# Patient Record
Sex: Female | Born: 1965
Health system: Southern US, Community
[De-identification: ages and names within clinical notes are randomized; demographics above are authoritative.]

## PROBLEM LIST (undated history)

## (undated) DIAGNOSIS — Z789 Other specified health status: Secondary | ICD-10-CM

## (undated) HISTORY — PX: NO PAST SURGERIES: SHX2092

## (undated) HISTORY — PX: COLONOSCOPY: SHX174

---

## 2005-06-08 HISTORY — PX: BREAST ENHANCEMENT SURGERY: SHX7

## 2016-04-21 DIAGNOSIS — D531 Other megaloblastic anemias, not elsewhere classified: Secondary | ICD-10-CM | POA: Insufficient documentation

## 2016-04-21 DIAGNOSIS — F411 Generalized anxiety disorder: Secondary | ICD-10-CM | POA: Insufficient documentation

## 2016-04-21 DIAGNOSIS — R42 Dizziness and giddiness: Secondary | ICD-10-CM | POA: Insufficient documentation

## 2016-04-28 ENCOUNTER — Other Ambulatory Visit: Payer: Self-pay | Admitting: Internal Medicine

## 2016-04-28 DIAGNOSIS — Z1231 Encounter for screening mammogram for malignant neoplasm of breast: Secondary | ICD-10-CM

## 2016-06-10 ENCOUNTER — Ambulatory Visit: Payer: BLUE CROSS/BLUE SHIELD

## 2016-06-30 ENCOUNTER — Other Ambulatory Visit: Payer: Self-pay | Admitting: Internal Medicine

## 2016-06-30 ENCOUNTER — Ambulatory Visit
Admission: RE | Admit: 2016-06-30 | Discharge: 2016-06-30 | Disposition: A | Discharge status: Home / Self Care | Payer: Federal, State, Local not specified - PPO | Source: Ambulatory Visit | Attending: Internal Medicine | Admitting: Internal Medicine

## 2016-06-30 DIAGNOSIS — R928 Other abnormal and inconclusive findings on diagnostic imaging of breast: Secondary | ICD-10-CM | POA: Diagnosis not present

## 2016-06-30 DIAGNOSIS — Z1231 Encounter for screening mammogram for malignant neoplasm of breast: Secondary | ICD-10-CM | POA: Diagnosis present

## 2016-07-02 ENCOUNTER — Other Ambulatory Visit: Payer: Self-pay | Admitting: Internal Medicine

## 2016-07-02 DIAGNOSIS — R928 Other abnormal and inconclusive findings on diagnostic imaging of breast: Secondary | ICD-10-CM

## 2016-07-02 DIAGNOSIS — N6489 Other specified disorders of breast: Secondary | ICD-10-CM

## 2016-07-27 ENCOUNTER — Ambulatory Visit
Admission: RE | Admit: 2016-07-27 | Discharge: 2016-07-27 | Disposition: A | Discharge status: Home / Self Care | Payer: Federal, State, Local not specified - PPO | Source: Ambulatory Visit | Attending: Internal Medicine | Admitting: Internal Medicine

## 2016-07-27 DIAGNOSIS — N6489 Other specified disorders of breast: Secondary | ICD-10-CM | POA: Insufficient documentation

## 2016-07-27 DIAGNOSIS — R928 Other abnormal and inconclusive findings on diagnostic imaging of breast: Secondary | ICD-10-CM | POA: Diagnosis not present

## 2016-07-28 ENCOUNTER — Other Ambulatory Visit: Payer: Self-pay | Admitting: Internal Medicine

## 2016-07-28 DIAGNOSIS — N6489 Other specified disorders of breast: Secondary | ICD-10-CM

## 2017-01-26 ENCOUNTER — Ambulatory Visit: Payer: BLUE CROSS/BLUE SHIELD

## 2017-01-26 ENCOUNTER — Other Ambulatory Visit: Payer: BLUE CROSS/BLUE SHIELD

## 2017-02-17 DIAGNOSIS — F329 Major depressive disorder, single episode, unspecified: Secondary | ICD-10-CM | POA: Diagnosis not present

## 2017-02-17 DIAGNOSIS — F411 Generalized anxiety disorder: Secondary | ICD-10-CM | POA: Diagnosis not present

## 2017-02-19 ENCOUNTER — Ambulatory Visit
Admission: RE | Admit: 2017-02-19 | Discharge: 2017-02-19 | Disposition: A | Discharge status: Home / Self Care | Payer: Federal, State, Local not specified - PPO | Source: Ambulatory Visit | Attending: Internal Medicine | Admitting: Internal Medicine

## 2017-02-19 DIAGNOSIS — N6489 Other specified disorders of breast: Secondary | ICD-10-CM

## 2017-02-19 DIAGNOSIS — R928 Other abnormal and inconclusive findings on diagnostic imaging of breast: Secondary | ICD-10-CM | POA: Diagnosis not present

## 2017-03-24 DIAGNOSIS — G8929 Other chronic pain: Secondary | ICD-10-CM | POA: Diagnosis not present

## 2017-03-24 DIAGNOSIS — M25561 Pain in right knee: Secondary | ICD-10-CM | POA: Diagnosis not present

## 2017-03-25 DIAGNOSIS — M25561 Pain in right knee: Secondary | ICD-10-CM | POA: Diagnosis not present

## 2017-03-31 DIAGNOSIS — F329 Major depressive disorder, single episode, unspecified: Secondary | ICD-10-CM | POA: Diagnosis not present

## 2017-03-31 DIAGNOSIS — F411 Generalized anxiety disorder: Secondary | ICD-10-CM | POA: Diagnosis not present

## 2017-04-02 DIAGNOSIS — H109 Unspecified conjunctivitis: Secondary | ICD-10-CM | POA: Diagnosis not present

## 2017-05-07 DIAGNOSIS — F329 Major depressive disorder, single episode, unspecified: Secondary | ICD-10-CM | POA: Diagnosis not present

## 2017-05-07 DIAGNOSIS — F411 Generalized anxiety disorder: Secondary | ICD-10-CM | POA: Diagnosis not present

## 2017-08-23 DIAGNOSIS — F411 Generalized anxiety disorder: Secondary | ICD-10-CM | POA: Diagnosis not present

## 2017-08-23 DIAGNOSIS — F329 Major depressive disorder, single episode, unspecified: Secondary | ICD-10-CM | POA: Diagnosis not present

## 2017-10-01 ENCOUNTER — Other Ambulatory Visit: Payer: Self-pay | Admitting: Internal Medicine

## 2017-10-01 DIAGNOSIS — N6489 Other specified disorders of breast: Secondary | ICD-10-CM

## 2017-10-13 DIAGNOSIS — R928 Other abnormal and inconclusive findings on diagnostic imaging of breast: Secondary | ICD-10-CM | POA: Diagnosis not present

## 2018-03-17 DIAGNOSIS — R05 Cough: Secondary | ICD-10-CM | POA: Diagnosis not present

## 2018-06-19 DIAGNOSIS — K047 Periapical abscess without sinus: Secondary | ICD-10-CM | POA: Diagnosis not present

## 2018-06-19 DIAGNOSIS — K0381 Cracked tooth: Secondary | ICD-10-CM | POA: Diagnosis not present

## 2018-07-24 ENCOUNTER — Emergency Department
Admission: EM | Admit: 2018-07-24 | Discharge: 2018-07-24 | Disposition: A | Discharge status: Home / Self Care | Payer: Federal, State, Local not specified - PPO | Attending: Emergency Medicine | Admitting: Emergency Medicine

## 2018-07-24 ENCOUNTER — Other Ambulatory Visit: Payer: Self-pay

## 2018-07-24 ENCOUNTER — Emergency Department: Payer: Federal, State, Local not specified - PPO

## 2018-07-24 DIAGNOSIS — R0789 Other chest pain: Secondary | ICD-10-CM | POA: Insufficient documentation

## 2018-07-24 LAB — BASIC METABOLIC PANEL
ANION GAP: 7 (ref 5–15)
BUN: 16 mg/dL (ref 6–20)
CALCIUM: 8.7 mg/dL — AB (ref 8.9–10.3)
CO2: 26 mmol/L (ref 22–32)
Chloride: 107 mmol/L (ref 98–111)
Creatinine, Ser: 0.81 mg/dL (ref 0.44–1.00)
GFR calc Af Amer: >60 mL/min (ref >60)
GFR calc non Af Amer: >60 mL/min (ref >60)
GLUCOSE: 70 mg/dL (ref 70–99)
POTASSIUM: 3.4 mmol/L — AB (ref 3.5–5.1)
Sodium: 140 mmol/L (ref 135–145)

## 2018-07-24 LAB — CBC
HCT: 37.8 % (ref 36.0–46.0)
Hemoglobin: 12 g/dL (ref 12.0–15.0)
MCH: 31.6 pg (ref 26.0–34.0)
MCHC: 31.7 g/dL (ref 30.0–36.0)
MCV: 99.5 fL (ref 80.0–100.0)
NRBC: 0 % (ref 0.0–0.2)
PLATELETS: 311 10*3/uL (ref 150–400)
RBC: 3.8 MIL/uL — ABNORMAL LOW (ref 3.87–5.11)
RDW: 11.5 % (ref 11.5–15.5)
WBC: 7.1 10*3/uL (ref 4.0–10.5)

## 2018-07-24 LAB — TROPONIN I

## 2018-07-24 NOTE — ED Triage Notes (Signed)
Pt states L sided CP x few weeks intermittently. States began working out and wasn't sure if the discomfort was from that. Denies SOB.   A&O, ambulatory. No distress noted.

## 2018-07-24 NOTE — ED Provider Notes (Signed)
[facility] Emergency Department Provider Note  ____________________________________________   I have reviewed the triage vital signs and the nursing notes. Where available I have reviewed prior notes and, if possible and indicated, outside hospital notes.    HISTORY  Chief Complaint Chest Pain    HPI Maria Buck is a 53 y.o. female   Who is a healthy woman she states at baseline, presents today complaining of right-sided and left-sided chest wall pain.  Patient recently began lifting weights.  And now, she has an achy discomfort in her chest wall and the pectoralis muscles.  She does not have exertional pain when she walks or exercises however, when she lifts weights using those muscles it hurts.  She also has some tenderness in her trapezius muscles because she was doing shrugs today.  She states that the pain seems to ease off and then when her physical trainer has her do more chest work, the pain comes back.  Does not have a history of weight lifting and any significant during the past.  She has no shortness of breath no leg swelling no pleuritic pain.  She has no fever no rash.  She has no nausea no vomiting.  She has no personal or family history of PE or DVT, no recent travel, no recent surgery she is not on any estrogens, pain is sharp, nonradiating, reproducible when she touches it or lies on that chest the wrong way.  He is convinced this is not involving her heart, but she does want to make sure.  History reviewed. No pertinent past medical history.  There are no active problems to display for this patient.   History reviewed. No pertinent surgical history.  Prior to Admission medications   Not on File    Allergies Patient has no known allergies.  Family History  Problem Relation Age of Onset  . Breast cancer Maternal Grandmother 61    Social History Social History   Tobacco Use  . Smoking status: Never Smoker  Substance Use Topics   . Alcohol use: Yes    Comment: rare   . Drug use: Not on file    Review of Systems Constitutional: No fever/chills Eyes: No visual changes. ENT: No sore throat. No stiff neck no neck pain Cardiovascular: See HPI regarding chest pain. Respiratory: Denies shortness of breath. Gastrointestinal:   no vomiting.  No diarrhea.  No constipation. Genitourinary: Negative for dysuria. Musculoskeletal: Negative lower extremity swelling Skin: Negative for rash. Neurological: Negative for severe headaches, focal weakness or numbness.   ____________________________________________   PHYSICAL EXAM:  VITAL SIGNS: ED Triage Vitals  Enc Vitals Group     BP 07/24/18 1319 (!) 139/91     Pulse Rate 07/24/18 1319 75     Resp 07/24/18 1319 18     Temp 07/24/18 1319 97.6 F (36.4 C)     Temp Source 07/24/18 1319 Oral     SpO2 07/24/18 1319 97 %     Weight 07/24/18 1320 205 lb (93 kg)     Height 07/24/18 1320 5\' 9"  (1.753 m)     Head Circumference --      Peak Flow --      Pain Score 07/24/18 1320 6     Pain Loc --      Pain Edu? --      Excl. in [place]? --     Constitutional: Alert and oriented. Well appearing and in no acute distress. Eyes: Conjunctivae are normal Head: Atraumatic HEENT: No  congestion/rhinnorhea. Mucous membranes are moist.  Oropharynx non-erythematous Neck:   Nontender with no meningismus, no masses, no stridor Cardiovascular: Normal rate, regular rhythm. Grossly normal heart sounds.  Good peripheral circulation. Chest: Female chaperone present, exam with consent of the patient.  There is tenderness palpation to the pectoralis muscles more on the left than the right but bilaterally, also when I range the patient's arm it hurts.  No crepitus no flail chest.  When I touch these areas patient states "ouch that is the pain right there" and pulls back she also tells me "you better not touch there again, that hurts". Respiratory: Normal respiratory effort.  No retractions. Lungs  CTAB. Abdominal: Soft and nontender. No distention. No guarding no rebound Back:  There is no focal tenderness or step off.  there is no midline tenderness there are no lesions noted. there is no CVA tenderness Musculoskeletal: No lower extremity tenderness, no upper extremity tenderness. No joint effusions, no DVT signs strong distal pulses no edema Neurologic:  Normal speech and language. No gross focal neurologic deficits are appreciated.  Skin:  Skin is warm, dry and intact. No rash noted. Psychiatric: Mood and affect are normal. Speech and behavior are normal.  ____________________________________________   LABS (all labs ordered are listed, but only abnormal results are displayed)  Labs Reviewed  BASIC METABOLIC PANEL - Abnormal; Notable for the following components:      Result Value   Potassium 3.4 (*)    Calcium 8.7 (*)    All other components within normal limits  CBC - Abnormal; Notable for the following components:   RBC 3.80 (*)    All other components within normal limits  TROPONIN I  TROPONIN I    Pertinent labs  results that were available during my care of the patient were reviewed by me and considered in my medical decision making (see chart for details). ____________________________________________  EKG  I personally interpreted any EKGs ordered by me or triage EKG shows normal sinus rhythm, no acute ST elevation or depression no acute ischemic changes LVH noted. ____________________________________________  RADIOLOGY  Pertinent labs & imaging results that were available during my care of the patient were reviewed by me and considered in my medical decision making (see chart for details). If possible, patient and/or family made aware of any abnormal findings.  Dg Chest 2 View  Result Date: 07/24/2018 CLINICAL DATA:  Left upper anterior chest tightness for 3 weeks. Nonsmoker. EXAM: CHEST - 2 VIEW COMPARISON:  None. FINDINGS: The heart size and mediastinal  contours are normal. The lungs are clear. There is no pleural effusion or pneumothorax. No acute osseous findings are identified. IMPRESSION: No active cardiopulmonary process. Electronically Signed   By: Richardean Sale M.D.   On: 07/24/2018 14:46   ____________________________________________    PROCEDURES  Procedure(s) performed: None  Procedures  Critical Care performed: None  ____________________________________________   INITIAL IMPRESSION / ASSESSMENT AND PLAN / ED COURSE  Pertinent labs & imaging results that were available during my care of the patient were reviewed by me and considered in my medical decision making (see chart for details).  Here with very reproducible chest wall pain exacerbated by unaccustomed weightlifting.  Very low suspicion for ACS PE dissection myocarditis endocarditis pneumonia pneumothorax broken ribs or referred abdominal pain.  However we will send a second troponin as a precaution given that she is 52.  If that is negative is my hope that we get her safely home.  I will refer her  to cardiology as a precaution etc. my disease.  I have advised however supportive care for these aches and pains.    ____________________________________________   FINAL CLINICAL IMPRESSION(S) / ED DIAGNOSES  Final diagnoses:  None      This chart was dictated using voice recognition software.  Despite best efforts to proofread,  errors can occur which can change meaning.      Schuyler Amor, MD 07/24/18 1755

## 2018-07-28 DIAGNOSIS — R079 Chest pain, unspecified: Secondary | ICD-10-CM | POA: Diagnosis not present

## 2018-08-01 DIAGNOSIS — R079 Chest pain, unspecified: Secondary | ICD-10-CM | POA: Diagnosis not present

## 2018-08-08 DIAGNOSIS — R079 Chest pain, unspecified: Secondary | ICD-10-CM | POA: Diagnosis not present

## 2018-09-17 DIAGNOSIS — K0889 Other specified disorders of teeth and supporting structures: Secondary | ICD-10-CM | POA: Diagnosis not present

## 2018-10-25 ENCOUNTER — Ambulatory Visit: Payer: Federal, State, Local not specified - PPO | Admitting: Cardiovascular Disease

## 2019-01-16 ENCOUNTER — Ambulatory Visit: Payer: Federal, State, Local not specified - PPO | Admitting: Cardiovascular Disease

## 2019-01-17 ENCOUNTER — Other Ambulatory Visit: Payer: Self-pay

## 2019-01-17 ENCOUNTER — Encounter: Payer: Self-pay | Admitting: Family Medicine

## 2019-01-17 ENCOUNTER — Ambulatory Visit (INDEPENDENT_AMBULATORY_CARE_PROVIDER_SITE_OTHER): Payer: Federal, State, Local not specified - PPO | Admitting: Family Medicine

## 2019-01-17 VITALS — BP 131/87 | HR 74 | Wt 199.4 lb

## 2019-01-17 DIAGNOSIS — Z01419 Encounter for gynecological examination (general) (routine) without abnormal findings: Secondary | ICD-10-CM

## 2019-01-17 DIAGNOSIS — R232 Flushing: Secondary | ICD-10-CM | POA: Insufficient documentation

## 2019-01-17 DIAGNOSIS — Z124 Encounter for screening for malignant neoplasm of cervix: Secondary | ICD-10-CM

## 2019-01-17 DIAGNOSIS — R079 Chest pain, unspecified: Secondary | ICD-10-CM

## 2019-01-17 DIAGNOSIS — Z1151 Encounter for screening for human papillomavirus (HPV): Secondary | ICD-10-CM

## 2019-01-17 DIAGNOSIS — R42 Dizziness and giddiness: Secondary | ICD-10-CM

## 2019-01-17 MED ORDER — MECLIZINE HCL 25 MG PO TABS: 25.0000 mg | ORAL_TABLET | Freq: Three times a day (TID) | ORAL | 2 refills | Status: DC | PRN

## 2019-01-17 NOTE — Progress Notes (Signed)
Hot flashes would like something for them Last pap- unknown  Mammogram 02/2017- need to be schedule  Need to sign a medical release for record from [facility].  No sti  Discuss getting blood work  Pressure on her neck  Renew rx for vertigo

## 2019-01-17 NOTE — Patient Instructions (Signed)
Preventive Care 51-51 Years Old, Female Preventive care refers to visits with your health care provider and lifestyle choices that can promote health and wellness. This includes:  A yearly physical exam. This may also be called an annual well check.  Regular dental visits and eye exams.  Immunizations.  Screening for certain conditions.  Healthy lifestyle choices, such as eating a healthy diet, getting regular exercise, not using drugs or products that contain nicotine and tobacco, and limiting alcohol use. What can I expect for my preventive care visit? Physical exam Your health care provider will check your:  Height and weight. This may be used to calculate body mass index (BMI), which tells if you are at a healthy weight.  Heart rate and blood pressure.  Skin for abnormal spots. Counseling Your health care provider may ask you questions about your:  Alcohol, tobacco, and drug use.  Emotional well-being.  Home and relationship well-being.  Sexual activity.  Eating habits.  Work and work environment.  Method of birth control.  Menstrual cycle.  Pregnancy history. What immunizations do I need?  Influenza (flu) vaccine  This is recommended every year. Tetanus, diphtheria, and pertussis (Tdap) vaccine  You may need a Td booster every 10 years. Varicella (chickenpox) vaccine  You may need this if you have not been vaccinated. Zoster (shingles) vaccine  You may need this after age 51. Measles, mumps, and rubella (MMR) vaccine  You may need at least one dose of MMR if you were born in 1957 or later. You may also need a second dose. Pneumococcal conjugate (PCV13) vaccine  You may need this if you have certain conditions and were not previously vaccinated. Pneumococcal polysaccharide (PPSV23) vaccine  You may need one or two doses if you smoke cigarettes or if you have certain conditions. Meningococcal conjugate (MenACWY) vaccine  You may need this if you  have certain conditions. Hepatitis A vaccine  You may need this if you have certain conditions or if you travel or work in places where you may be exposed to hepatitis A. Hepatitis B vaccine  You may need this if you have certain conditions or if you travel or work in places where you may be exposed to hepatitis B. Haemophilus influenzae type b (Hib) vaccine  You may need this if you have certain conditions. Human papillomavirus (HPV) vaccine  If recommended by your health care provider, you may need three doses over 6 months. You may receive vaccines as individual doses or as more than one vaccine together in one shot (combination vaccines). Talk with your health care provider about the risks and benefits of combination vaccines. What tests do I need? Blood tests  Lipid and cholesterol levels. These may be checked every 5 years, or more frequently if you are over 51 years old.  Hepatitis C test.  Hepatitis B test. Screening  Lung cancer screening. You may have this screening every year starting at age 51 if you have a 30-pack-year history of smoking and currently smoke or have quit within the past 15 years.  Colorectal cancer screening. All adults should have this screening starting at age 51 and continuing until age 51. Your health care provider may recommend screening at age 45 if you are at increased risk. You will have tests every 1-10 years, depending on your results and the type of screening test.  Diabetes screening. This is done by checking your blood sugar (glucose) after you have not eaten for a while (fasting). You may have this   done every 1-3 years.  Mammogram. This may be done every 1-2 years. Talk with your health care provider about when you should start having regular mammograms. This may depend on whether you have a family history of breast cancer.  BRCA-related cancer screening. This may be done if you have a family history of breast, ovarian, tubal, or peritoneal  cancers.  Pelvic exam and Pap test. This may be done every 3 years starting at age 51. Starting at age 51, this may be done every 5 years if you have a Pap test in combination with an HPV test. Other tests  Sexually transmitted disease (STD) testing.  Bone density scan. This is done to screen for osteoporosis. You may have this scan if you are at high risk for osteoporosis. Follow these instructions at home: Eating and drinking  Eat a diet that includes fresh fruits and vegetables, whole grains, lean protein, and low-fat dairy.  Take vitamin and mineral supplements as recommended by your health care provider.  Do not drink alcohol if: ? Your health care provider tells you not to drink. ? You are pregnant, may be pregnant, or are planning to become pregnant.  If you drink alcohol: ? Limit how much you have to 0-1 drink a day. ? Be aware of how much alcohol is in your drink. In the [place] one drink equals one 12 oz bottle of beer (355 mL), one 5 oz glass of wine (148 mL), or one 1 oz glass of hard liquor (44 mL). Lifestyle  Take daily care of your teeth and gums.  Stay active. Exercise for at least 30 minutes on 5 or more days each week.  Do not use any products that contain nicotine or tobacco, such as cigarettes, e-cigarettes, and chewing tobacco. If you need help quitting, ask your health care provider.  If you are sexually active, practice safe sex. Use a condom or other form of birth control (contraception) in order to prevent pregnancy and STIs (sexually transmitted infections).  If told by your health care provider, take low-dose aspirin daily starting at age 51. What's next?  Visit your health care provider once a year for a well check visit.  Ask your health care provider how often you should have your eyes and teeth checked.  Stay up to date on all vaccines. This information is not intended to replace advice given to you by your health care provider. Make sure you  discuss any questions you have with your health care provider. Document Released: 06/21/2015 Document Revised: 02/03/2018 Document Reviewed: 02/03/2018 Elsevier Patient Education  2020 Elsevier Inc.  

## 2019-01-17 NOTE — Assessment & Plan Note (Signed)
Wants something for hot flashes, though it is not clear she has been 1 year without cycles and would need a discussion about HRT vs. SSRI (has f/h breast cancer)

## 2019-01-17 NOTE — Assessment & Plan Note (Signed)
Unclear etiology--wonder if it might be related to pendulous breasts, she was not convinced.

## 2019-01-17 NOTE — Progress Notes (Signed)
  Subjective:     Maria Buck is a 53 y.o. female and is here for a comprehensive physical exam. The patient reports problems - multiple. Having hot flashes and night sweats and not sleeping well. LMP was 9-12 months ago. Has chest pain. Has had negative cardiac wu. Notes pain is at night and is worse with movement. Pain is not exertional. Pain has been going on for 10 months.  She is an ATF agent  The following portions of the patient's history were reviewed and updated as appropriate: allergies, current medications, past family history, past medical history, past social history, past surgical history and problem list.  Review of Systems Pertinent items noted in HPI and remainder of comprehensive ROS otherwise negative.   Objective:    BP 131/87   Pulse 74   Wt 199 lb 6.4 oz (90.4 kg)   BMI 29.45 kg/m  General appearance: alert, cooperative and appears stated age Head: Normocephalic, without obvious abnormality, atraumatic Neck: no adenopathy, supple, symmetrical, trachea midline and thyroid not enlarged, symmetric, no tenderness/mass/nodules Lungs: clear to auscultation bilaterally Breasts: normal appearance, no masses or tenderness Heart: regular rate and rhythm, S1, S2 normal, no murmur, click, rub or gallop Abdomen: soft, non-tender; bowel sounds normal; no masses,  no organomegaly Pelvic: cervix normal in appearance, external genitalia normal, no adnexal masses or tenderness, no cervical motion tenderness, uterus normal size, shape, and consistency and vagina normal without discharge Extremities: extremities normal, atraumatic, no cyanosis or edema Pulses: 2+ and symmetric Skin: Skin color, texture, turgor normal. No rashes or lesions Lymph nodes: Cervical, supraclavicular, and axillary nodes normal. Neurologic: Grossly normal    Assessment:    GYN female exam.      Plan:   Problem List Items Addressed This Visit      Unprioritized   Chest pain with low risk for  cardiac etiology    Unclear etiology--wonder if it might be related to pendulous breasts, she was not convinced.      Intermittent vertigo    Refilled her Meclizine      Hot flashes    Wants something for hot flashes, though it is not clear she has been 1 year without cycles and would need a discussion about HRT vs. SSRI (has f/h breast cancer)      Relevant Orders   Follicle stimulating hormone    Other Visit Diagnoses    Screening for malignant neoplasm of cervix    -  Primary   Relevant Orders   Cytology - PAP( [facility])   Encounter for gynecological examination without abnormal finding       Relevant Medications   meclizine (ANTIVERT) 25 MG tablet   Other Relevant Orders   CBC   Comprehensive metabolic panel   TSH   Hemoglobin A1c   VITAMIN D 25 Hydroxy (Vit-D Deficiency, Fractures)   Lipid panel   MM 3D SCREEN BREAST BILATERAL   Ambulatory referral to Gastroenterology     Return in 1 year (on 01/17/2020) for PCP referral--[facility].    See After Visit Summary for Counseling Recommendations

## 2019-01-17 NOTE — Assessment & Plan Note (Signed)
Refilled her Meclizine

## 2019-01-18 LAB — COMPREHENSIVE METABOLIC PANEL
ALT: 17 IU/L (ref 0–32)
AST: 22 IU/L (ref 0–40)
Albumin/Globulin Ratio: 1.3 (ref 1.2–2.2)
Albumin: 4.5 g/dL (ref 3.8–4.9)
Alkaline Phosphatase: 88 IU/L (ref 39–117)
BUN/Creatinine Ratio: 20 (ref 9–23)
BUN: 18 mg/dL (ref 6–24)
Bilirubin Total: 0.3 mg/dL (ref 0.0–1.2)
CO2: 25 mmol/L (ref 20–29)
Calcium: 9.6 mg/dL (ref 8.7–10.2)
Chloride: 104 mmol/L (ref 96–106)
Creatinine, Ser: 0.88 mg/dL (ref 0.57–1.00)
GFR calc Af Amer: 88 mL/min/{1.73_m2} (ref >59)
GFR calc non Af Amer: 76 mL/min/{1.73_m2} (ref >59)
Globulin, Total: 3.6 g/dL (ref 1.5–4.5)
Glucose: 99 mg/dL (ref 65–99)
Potassium: 4.1 mmol/L (ref 3.5–5.2)
Sodium: 144 mmol/L (ref 134–144)
Total Protein: 8.1 g/dL (ref 6.0–8.5)

## 2019-01-18 LAB — LIPID PANEL
Chol/HDL Ratio: 3 ratio (ref 0.0–4.4)
Cholesterol, Total: 179 mg/dL (ref 100–199)
HDL: 60 mg/dL (ref >39)
LDL Calculated: 97 mg/dL (ref 0–99)
Triglycerides: 108 mg/dL (ref 0–149)
VLDL Cholesterol Cal: 22 mg/dL (ref 5–40)

## 2019-01-18 LAB — CBC
Hematocrit: 38.8 % (ref 34.0–46.6)
Hemoglobin: 12.6 g/dL (ref 11.1–15.9)
MCH: 32 pg (ref 26.6–33.0)
MCHC: 32.5 g/dL (ref 31.5–35.7)
MCV: 99 fL — ABNORMAL HIGH (ref 79–97)
RBC: 3.94 x10E6/uL (ref 3.77–5.28)
RDW: 11.7 % (ref 11.7–15.4)
WBC: 7.3 10*3/uL (ref 3.4–10.8)

## 2019-01-18 LAB — HEMOGLOBIN A1C
Est. average glucose Bld gHb Est-mCnc: 97 mg/dL
Hgb A1c MFr Bld: 5 % (ref 4.8–5.6)

## 2019-01-18 LAB — VITAMIN D 25 HYDROXY (VIT D DEFICIENCY, FRACTURES): Vit D, 25-Hydroxy: 47.6 ng/mL (ref 30.0–100.0)

## 2019-01-18 LAB — TSH: TSH: 1.9 u[IU]/mL (ref 0.450–4.500)

## 2019-01-19 LAB — SPECIMEN STATUS REPORT

## 2019-01-19 LAB — CYTOLOGY - PAP
Diagnosis: NEGATIVE
HPV: NOT DETECTED

## 2019-01-19 LAB — FOLLICLE STIMULATING HORMONE: FSH: 95 m[IU]/mL

## 2019-01-24 ENCOUNTER — Telehealth: Payer: Self-pay | Admitting: Radiology

## 2019-01-24 ENCOUNTER — Telehealth: Payer: Self-pay | Admitting: *Deleted

## 2019-01-24 NOTE — Telephone Encounter (Signed)
-----   Message from Donnamae Jude, MD sent at 01/18/2019 12:47 PM EDT ----- Labs appear normal.

## 2019-01-24 NOTE — Telephone Encounter (Signed)
Called to schedule virtual visit with Dr Kennon Rounds, no answer and unable to leave voicemail.

## 2019-01-24 NOTE — Telephone Encounter (Signed)
Pt came by office to get lab results, informed they where normal, pt asked about getting meds for hot flashes, told pt I would message Dr Kennon Rounds and get back to her.

## 2019-01-26 ENCOUNTER — Other Ambulatory Visit: Payer: Self-pay | Admitting: Family Medicine

## 2019-01-26 DIAGNOSIS — N6489 Other specified disorders of breast: Secondary | ICD-10-CM

## 2019-01-26 DIAGNOSIS — Z1231 Encounter for screening mammogram for malignant neoplasm of breast: Secondary | ICD-10-CM

## 2019-01-29 DIAGNOSIS — H9313 Tinnitus, bilateral: Secondary | ICD-10-CM

## 2019-01-30 ENCOUNTER — Encounter: Payer: Self-pay | Admitting: *Deleted

## 2019-02-21 ENCOUNTER — Ambulatory Visit
Admission: RE | Admit: 2019-02-21 | Discharge: 2019-02-21 | Disposition: A | Discharge status: Home / Self Care | Payer: Federal, State, Local not specified - PPO | Source: Ambulatory Visit | Attending: Family Medicine | Admitting: Family Medicine

## 2019-02-21 ENCOUNTER — Other Ambulatory Visit: Payer: Self-pay

## 2019-02-21 DIAGNOSIS — N6489 Other specified disorders of breast: Secondary | ICD-10-CM | POA: Diagnosis not present

## 2019-02-21 DIAGNOSIS — Z1231 Encounter for screening mammogram for malignant neoplasm of breast: Secondary | ICD-10-CM | POA: Insufficient documentation

## 2019-02-21 DIAGNOSIS — Z20822 Contact with and (suspected) exposure to covid-19: Secondary | ICD-10-CM

## 2019-02-21 DIAGNOSIS — R928 Other abnormal and inconclusive findings on diagnostic imaging of breast: Secondary | ICD-10-CM | POA: Diagnosis not present

## 2019-02-21 DIAGNOSIS — R6889 Other general symptoms and signs: Secondary | ICD-10-CM | POA: Diagnosis not present

## 2019-02-22 ENCOUNTER — Ambulatory Visit: Payer: Federal, State, Local not specified - PPO | Admitting: Family Medicine

## 2019-02-23 LAB — NOVEL CORONAVIRUS, NAA: SARS-CoV-2, NAA: NOT DETECTED

## 2019-03-21 ENCOUNTER — Ambulatory Visit: Payer: Federal, State, Local not specified - PPO | Admitting: Family Medicine

## 2019-04-05 ENCOUNTER — Other Ambulatory Visit: Payer: Self-pay

## 2019-04-05 ENCOUNTER — Encounter: Payer: Self-pay | Admitting: Family Medicine

## 2019-04-05 ENCOUNTER — Ambulatory Visit (INDEPENDENT_AMBULATORY_CARE_PROVIDER_SITE_OTHER): Payer: Federal, State, Local not specified - PPO | Admitting: Family Medicine

## 2019-04-05 VITALS — BP 102/64 | HR 70 | Temp 98.1°F | Ht 69.0 in | Wt 192.4 lb

## 2019-04-05 DIAGNOSIS — Z0000 Encounter for general adult medical examination without abnormal findings: Secondary | ICD-10-CM

## 2019-04-05 DIAGNOSIS — R0789 Other chest pain: Secondary | ICD-10-CM

## 2019-04-05 DIAGNOSIS — N3090 Cystitis, unspecified without hematuria: Secondary | ICD-10-CM

## 2019-04-05 DIAGNOSIS — R001 Bradycardia, unspecified: Secondary | ICD-10-CM

## 2019-04-05 DIAGNOSIS — Z23 Encounter for immunization: Secondary | ICD-10-CM

## 2019-04-05 DIAGNOSIS — R300 Dysuria: Secondary | ICD-10-CM

## 2019-04-05 LAB — POC URINALSYSI DIPSTICK (AUTOMATED)
Bilirubin, UA: NEGATIVE
Blood, UA: NEGATIVE
Glucose, UA: NEGATIVE
Ketones, UA: NEGATIVE
Nitrite, UA: NEGATIVE
Protein, UA: POSITIVE — AB
Spec Grav, UA: >=1.03 — AB (ref 1.010–1.025)
Urobilinogen, UA: NEGATIVE E.U./dL — AB
pH, UA: 6 (ref 5.0–8.0)

## 2019-04-05 MED ORDER — NITROFURANTOIN MONOHYD MACRO 100 MG PO CAPS: 100.0000 mg | ORAL_CAPSULE | Freq: Two times a day (BID) | ORAL | 0 refills | Status: AC

## 2019-04-05 NOTE — Progress Notes (Signed)
Subjective:    Patient ID: Maria Buck, female    DOB: 1965/09/19, 53 y.o.   MRN: [id number]  HPI This is a 53 yo female who presents today to establish care and for CPE. Works for the [employer]. Enjoys football, travel, movies.    Last CPE- gyn 01/2019 Mammo- 01/2019 Pap- 01/2019 Colonoscopy- never, is currently working to schedule Tdap- 04/21/2016 Flu- annual Eye- last year Dental- regular Exercise- doing a boot camp Stress- high for awhile Sleep- sleep is up and down, occasionally uses Zyquil for difficulty going and staying asleep.  Post menopausal- hot flashes, have improved  "Pressure on her heart"- has been seen by cardiology in past with negative stress test. Doesn't bother her with [facility]. Feels like a pressure on upper left side of chest, lasts "awhile." Several times a week, usually when lying in bed, no palpitations or racing. Not usually sore if she touches.   History reviewed. No pertinent past medical history. History reviewed. No pertinent surgical history. Family History  Problem Relation Age of Onset  . Breast cancer Maternal Grandmother 29  . Heart attack Maternal Grandmother   . Diabetes Paternal Grandmother   . Diabetes Father   . Heart attack Father   . Thyroid cancer Mother   . Diabetes Sister   . Thyroid cancer Sister   . Diabetes Paternal Aunt   . Heart attack Maternal Aunt   . Heart attack Maternal Uncle   . Thyroid cancer Maternal Aunt    Social History   Tobacco Use  . Smoking status: Never Smoker  . Smokeless tobacco: Never Used  Substance Use Topics  . Alcohol use: Yes    Comment: rare   . Drug use: Never      Review of Systems  Constitutional: Positive for fatigue (chronic).  HENT: Negative.   Eyes: Negative.   Respiratory: Negative.   Cardiovascular: Positive for chest pain (see HPI). Negative for palpitations and leg swelling.  Gastrointestinal: Negative.   Endocrine: Negative.   Genitourinary: Positive for  dysuria (x 24 hours).  Musculoskeletal: Negative.   Skin: Negative.   Allergic/Immunologic: Negative.   Neurological: Positive for dizziness (rare vertigo).  Psychiatric/Behavioral: Positive for sleep disturbance.   EKG- sinus bradycardia, compared to prior tracing with no changes    Objective:   Physical Exam Vitals signs reviewed.  Constitutional:      Appearance: Normal appearance. She is normal weight.  HENT:     Head: Normocephalic and atraumatic.     Right Ear: External ear normal.     Left Ear: External ear normal.  Eyes:     Conjunctiva/sclera: Conjunctivae normal.  Cardiovascular:     Rate and Rhythm: Normal rate and regular rhythm.     Pulses: Normal pulses.     Heart sounds: Normal heart sounds.     Comments: Rate normal with initial VS. EKG/ auscultation with bradycardia 50s.  Pulmonary:     Effort: Pulmonary effort is normal.     Breath sounds: Normal breath sounds.  Musculoskeletal: Normal range of motion.     Right lower leg: No edema.     Left lower leg: No edema.  Skin:    General: Skin is warm and dry.  Neurological:     Mental Status: She is alert and oriented to person, place, and time.  Psychiatric:        Mood and Affect: Mood normal.        Behavior: Behavior normal.  Thought Content: Thought content normal.        Judgment: Judgment normal.       BP 102/64   Pulse 70   Temp 98.1 F (36.7 C)   Ht 5\' 9"  (1.753 m)   Wt 192 lb 6.4 oz (87.3 kg)   SpO2 98%   BMI 28.41 kg/m  Wt Readings from Last 3 Encounters:  04/05/19 192 lb 6.4 oz (87.3 kg)  01/17/19 199 lb 6.4 oz (90.4 kg)  07/24/18 205 lb (93 kg)   Results for orders placed or performed in visit on 04/05/19  POCT Urinalysis Dipstick (Automated)  Result Value Ref Range   Color, UA yellow    Clarity, UA clear    Glucose, UA Negative Negative   Bilirubin, UA negative    Ketones, UA negative    Spec Grav, UA >=1.030 (A) 1.010 - 1.025   Blood, UA negative    pH, UA 6.0 5.0 -  8.0   Protein, UA Positive (A) Negative   Urobilinogen, UA negative (A) 0.2 or 1.0 E.U./dL   Nitrite, UA negative    Leukocytes, UA Trace (A) Negative       Assessment & Plan:  1. Annual physical exam - Discussed and encouraged healthy lifestyle choices- adequate sleep, regular exercise, stress management and healthy food choices.  - has had recent labs, reviewed with patient  2. Other chest pain - negative cardiology work up previously, will check holter monitor with bradycardia  - EKG 12-Lead - LONG TERM MONITOR (3-14 DAYS); Future  3. Dysuria - POCT Urinalysis Dipstick (Automated)  4. Need for immunization against influenza - Flu Vaccine QUAD 36+ mos IM  5. Cystitis - nitrofurantoin, macrocrystal-monohydrate, (MACROBID) 100 MG capsule; Take 1 capsule (100 mg total) by mouth 2 (two) times daily for 5 days.  Dispense: 10 capsule; Refill: 0 - Urine Culture  6. Bradycardia - see # 2 - LONG TERM MONITOR (3-14 DAYS); Future   Clarene Reamer, FNP-BC  [facility]  04/05/2019 10:36 PM

## 2019-04-05 NOTE — Progress Notes (Signed)
New Patient Office Visit  Subjective:  Patient ID: Maria Buck, female    DOB: 1965/06/14  Age: 53 y.o. MRN: [id number]  CC:  Chief Complaint  Patient presents with  . Establish Care    HPI Maria Buck presents to establish a primary healthcare provider.  Pt works for [employer], partially works from home. She lives alone.  Pt reports some pressure over her heart.  Pt went to cardiologist a year ago and did a stress test that was negative. Pt does boot camp and there is no symptoms during exercise time. Pt reports she feels it several time a week for short period of time. She states it is not painful but feels more like pressure.  She states the feeling of pressure gets worse during the night. Pt denies palpitations.  Pt also reports some dysuria and frequency that started yesterday. s1   Pap-8/20 Mammogram-02/2019 Colonoscopy-pt is trying to schedule it Tdap-9/17 Flu- 03/2019 Dental-regular Eye- last year Exercise- boot camp Sleep-pt occasionally takes zquil for sleep.   History reviewed. No pertinent past medical history.  History reviewed. No pertinent surgical history.  Family History  Problem Relation Age of Onset  . Breast cancer Maternal Grandmother 52  . Heart attack Maternal Grandmother   . Diabetes Paternal Grandmother   . Diabetes Father   . Heart attack Father   . Thyroid cancer Mother   . Diabetes Sister   . Thyroid cancer Sister   . Diabetes Paternal Aunt   . Heart attack Maternal Aunt   . Heart attack Maternal Uncle   . Thyroid cancer Maternal Aunt     Social History   Socioeconomic History  . Marital status: Single    Spouse name: Not on file  . Number of children: Not on file  . Years of education: Not on file  . Highest education level: Not on file  Occupational History  . Not on file  Social Needs  . Financial resource strain: Not on file  . Food insecurity    Worry: Not on file    Inability: Not on file  .  Transportation needs    Medical: Not on file    Non-medical: Not on file  Tobacco Use  . Smoking status: Never Smoker  . Smokeless tobacco: Never Used  Substance and Sexual Activity  . Alcohol use: Yes    Comment: rare   . Drug use: Never  . Sexual activity: Yes  Lifestyle  . Physical activity    Days per week: Not on file    Minutes per session: Not on file  . Stress: Not on file  Relationships  . Social Herbalist on phone: Not on file    Gets together: Not on file    Attends religious service: Not on file    Active member of club or organization: Not on file    Attends meetings of clubs or organizations: Not on file    Relationship status: Not on file  . Intimate partner violence    Fear of current or ex partner: Not on file    Emotionally abused: Not on file    Physically abused: Not on file    Forced sexual activity: Not on file  Other Topics Concern  . Not on file  Social History Narrative  . Not on file    ROS Review of Systems  Constitutional: Negative for activity change, chills and fatigue.  HENT: Negative for congestion, ear pain, rhinorrhea and sinus  pain.   Eyes: Negative for pain.  Respiratory: Negative for cough, chest tightness, shortness of breath and wheezing.   Cardiovascular: Negative for chest pain, palpitations and leg swelling.       Occasional feeling of pressure over heart  Gastrointestinal: Negative for abdominal pain, constipation, diarrhea, nausea and vomiting.  Genitourinary: Positive for dysuria. Negative for difficulty urinating, flank pain, frequency, hematuria and urgency.       Burning with urination  Musculoskeletal: Negative for back pain.  Skin: Negative.   Neurological: Negative for facial asymmetry, weakness and headaches.    Objective:   Today's Vitals: BP 102/64   Pulse 70   Temp 98.1 F (36.7 C)   Ht 5\' 9"  (1.753 m)   Wt 87.3 kg   SpO2 98%   BMI 28.41 kg/m   Physical Exam Vitals signs and nursing note  reviewed.  Constitutional:      Appearance: Normal appearance.  HENT:     Head: Normocephalic and atraumatic.  Neck:     Musculoskeletal: Normal range of motion and neck supple. No neck rigidity or muscular tenderness.  Cardiovascular:     Rate and Rhythm: Normal rate and regular rhythm.     Pulses: Normal pulses.     Heart sounds: Normal heart sounds. No murmur. No friction rub. No gallop.   Pulmonary:     Effort: Pulmonary effort is normal. No respiratory distress.     Breath sounds: Normal breath sounds. No rhonchi or rales.  Chest:     Chest wall: No tenderness.  Abdominal:     General: Bowel sounds are normal.     Palpations: Abdomen is soft.  Musculoskeletal: Normal range of motion.     Right lower leg: No edema.     Left lower leg: No edema.  Skin:    General: Skin is warm and dry.  Neurological:     Mental Status: She is alert and oriented to person, place, and time. Mental status is at baseline.  Psychiatric:        Mood and Affect: Mood normal.        Behavior: Behavior normal.     Assessment & Plan:  1. Other chest pain Evaluated for intermittent chest pressure. Pt is going to have heart monitor to evaluate  - EKG 12-Lead  2. Dysuria - POCT Urinalysis Dipstick (Automated)  3. Need for immunization against influenza - Flu Vaccine QUAD 36+ mos IM  4. Cystitis - nitrofurantoin, macrocrystal-monohydrate, (MACROBID) 100 MG capsule; Take 1 capsule (100 mg total) by mouth 2 (two) times daily for 5 days.  Dispense: 10 capsule; Refill: 0 - Urine Culture  Problem List Items Addressed This Visit    None      Outpatient Encounter Medications as of 04/05/2019  Medication Sig  . meclizine (ANTIVERT) 25 MG tablet Take 1 tablet (25 mg total) by mouth 3 (three) times daily as needed for dizziness.   No facility-administered encounter medications on file as of 04/05/2019.     Follow-up: in a year  Rica Koyanagi, RN

## 2019-04-05 NOTE — Patient Instructions (Signed)
Good to see you today  You will get a call about the heart monitor  Start antibiotic, I will notify you of urine culture results   Health Maintenance for Postmenopausal Women Menopause is a normal process in which your ability to get pregnant comes to an end. This process happens slowly over many months or years, usually between the ages of 79 and 2. Menopause is complete when you have missed your menstrual periods for 12 months. It is important to talk with your health care provider about some of the most common conditions that affect women after menopause (postmenopausal women). These include heart disease, cancer, and bone loss (osteoporosis). Adopting a healthy lifestyle and getting preventive care can help to promote your health and wellness. The actions you take can also lower your chances of developing some of these common conditions. What should I know about menopause? During menopause, you may get a number of symptoms, such as:  Hot flashes. These can be moderate or severe.  Night sweats.  Decrease in sex drive.  Mood swings.  Headaches.  Tiredness.  Irritability.  Memory problems.  Insomnia. Choosing to treat or not to treat these symptoms is a decision that you make with your health care provider. Do I need hormone replacement therapy?  Hormone replacement therapy is effective in treating symptoms that are caused by menopause, such as hot flashes and night sweats.  Hormone replacement carries certain risks, especially as you become older. If you are thinking about using estrogen or estrogen with progestin, discuss the benefits and risks with your health care provider. What is my risk for heart disease and stroke? The risk of heart disease, heart attack, and stroke increases as you age. One of the causes may be a change in the body's hormones during menopause. This can affect how your body uses dietary fats, triglycerides, and cholesterol. Heart attack and stroke are  medical emergencies. There are many things that you can do to help prevent heart disease and stroke. Watch your blood pressure  High blood pressure causes heart disease and increases the risk of stroke. This is more likely to develop in people who have high blood pressure readings, are of African descent, or are overweight.  Have your blood pressure checked: ? Every 3-5 years if you are 30-78 years of age. ? Every year if you are 72 years old or older. Eat a healthy diet   Eat a diet that includes plenty of vegetables, fruits, low-fat dairy products, and lean protein.  Do not eat a lot of foods that are high in solid fats, added sugars, or sodium. Get regular exercise Get regular exercise. This is one of the most important things you can do for your health. Most adults should:  Try to exercise for at least 150 minutes each week. The exercise should increase your heart rate and make you sweat (moderate-intensity exercise).  Try to do strengthening exercises at least twice each week. Do these in addition to the moderate-intensity exercise.  Spend less time sitting. Even light physical activity can be beneficial. Other tips  Work with your health care provider to achieve or maintain a healthy weight.  Do not use any products that contain nicotine or tobacco, such as cigarettes, e-cigarettes, and chewing tobacco. If you need help quitting, ask your health care provider.  Know your numbers. Ask your health care provider to check your cholesterol and your blood sugar (glucose). Continue to have your blood tested as directed by your health care provider.  Do I need screening for cancer? Depending on your health history and family history, you may need to have cancer screening at different stages of your life. This may include screening for:  Breast cancer.  Cervical cancer.  Lung cancer.  Colorectal cancer. What is my risk for osteoporosis? After menopause, you may be at increased  risk for osteoporosis. Osteoporosis is a condition in which bone destruction happens more quickly than new bone creation. To help prevent osteoporosis or the bone fractures that can happen because of osteoporosis, you may take the following actions:  If you are 55-30 years old, get at least 1,000 mg of calcium and at least 600 mg of vitamin D per day.  If you are older than age 76 but younger than age 18, get at least 1,200 mg of calcium and at least 600 mg of vitamin D per day.  If you are older than age 103, get at least 1,200 mg of calcium and at least 800 mg of vitamin D per day. Smoking and drinking excessive alcohol increase the risk of osteoporosis. Eat foods that are rich in calcium and vitamin D, and do weight-bearing exercises several times each week as directed by your health care provider. How does menopause affect my mental health? Depression may occur at any age, but it is more common as you become older. Common symptoms of depression include:  Low or sad mood.  Changes in sleep patterns.  Changes in appetite or eating patterns.  Feeling an overall lack of motivation or enjoyment of activities that you previously enjoyed.  Frequent crying spells. Talk with your health care provider if you think that you are experiencing depression. General instructions See your health care provider for regular wellness exams and vaccines. This may include:  Scheduling regular health, dental, and eye exams.  Getting and maintaining your vaccines. These include: ? Influenza vaccine. Get this vaccine each year before the flu season begins. ? Pneumonia vaccine. ? Shingles vaccine. ? Tetanus, diphtheria, and pertussis (Tdap) booster vaccine. Your health care provider may also recommend other immunizations. Tell your health care provider if you have ever been abused or do not feel safe at home. Summary  Menopause is a normal process in which your ability to get pregnant comes to an end.   This condition causes hot flashes, night sweats, decreased interest in sex, mood swings, headaches, or lack of sleep.  Treatment for this condition may include hormone replacement therapy.  Take actions to keep yourself healthy, including exercising regularly, eating a healthy diet, watching your weight, and checking your blood pressure and blood sugar levels.  Get screened for cancer and depression. Make sure that you are up to date with all your vaccines. This information is not intended to replace advice given to you by your health care provider. Make sure you discuss any questions you have with your health care provider. Document Released: 07/17/2005 Document Revised: 05/18/2018 Document Reviewed: 05/18/2018 Elsevier Patient Education  2020 [employer].

## 2019-04-07 LAB — URINE CULTURE
MICRO NUMBER:: 1040248
SPECIMEN QUALITY:: ADEQUATE

## 2019-04-17 ENCOUNTER — Encounter: Payer: Self-pay | Admitting: Family Medicine

## 2019-05-10 ENCOUNTER — Encounter: Payer: Self-pay | Admitting: Family Medicine

## 2019-05-10 NOTE — Telephone Encounter (Signed)
Spoke with patient. 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 Patient did not want to go to the drive through and wait for hours. She will call [facility] and [facility] in [place]. I advised patient I did not know if she has to make appointment or any other specifics about their facilities.

## 2019-08-14 DIAGNOSIS — M1711 Unilateral primary osteoarthritis, right knee: Secondary | ICD-10-CM | POA: Diagnosis not present

## 2019-08-14 DIAGNOSIS — S8991XA Unspecified injury of right lower leg, initial encounter: Secondary | ICD-10-CM | POA: Diagnosis not present

## 2019-08-14 DIAGNOSIS — S86911A Strain of unspecified muscle(s) and tendon(s) at lower leg level, right leg, initial encounter: Secondary | ICD-10-CM | POA: Diagnosis not present

## 2019-09-26 ENCOUNTER — Telehealth (INDEPENDENT_AMBULATORY_CARE_PROVIDER_SITE_OTHER): Payer: Self-pay | Admitting: Gastroenterology

## 2019-09-26 ENCOUNTER — Other Ambulatory Visit (INDEPENDENT_AMBULATORY_CARE_PROVIDER_SITE_OTHER): Payer: Self-pay

## 2019-09-26 DIAGNOSIS — Z1211 Encounter for screening for malignant neoplasm of colon: Secondary | ICD-10-CM

## 2019-09-26 NOTE — Progress Notes (Signed)
Gastroenterology Pre-Procedure Review  Request Date: Monday May 10th Requesting Physician: Dr. Marius Ditch  PATIENT REVIEW QUESTIONS: The patient responded to the following health history questions as indicated:    1. Are you having any GI issues? no 2. Do you have a personal history of Polyps? no 3. Do you have a family history of Colon Cancer or Polyps? no 4. Diabetes Mellitus? no 5. Joint replacements in the past 12 months?no 6. Major health problems in the past 3 months?no 7. Any artificial heart valves, MVP, or defibrillator?no    MEDICATIONS & ALLERGIES:    Patient reports the following regarding taking any anticoagulation/antiplatelet therapy:   Plavix, Coumadin, Eliquis, Xarelto, Lovenox, Pradaxa, Brilinta, or Effient? no Aspirin? no  Patient confirms/reports the following medications:  Current Outpatient Medications  Medication Sig Dispense Refill  . meclizine (ANTIVERT) 25 MG tablet Take 1 tablet (25 mg total) by mouth 3 (three) times daily as needed for dizziness. (Patient not taking: Reported on 09/26/2019) 30 tablet 2   No current facility-administered medications for this visit.    Patient confirms/reports the following allergies:  No Known Allergies  No orders of the defined types were placed in this encounter.   AUTHORIZATION INFORMATION Primary Insurance: 1D#: Group #:  Secondary Insurance: 1D#: Group #:  SCHEDULE INFORMATION: Date: Monday 05/10/21Time: Location:[facility]

## 2019-10-12 ENCOUNTER — Other Ambulatory Visit
Admission: RE | Admit: 2019-10-12 | Discharge: 2019-10-12 | Disposition: A | Discharge status: Home / Self Care | Payer: Federal, State, Local not specified - PPO | Source: Ambulatory Visit | Attending: Gastroenterology | Admitting: Gastroenterology

## 2019-10-12 ENCOUNTER — Other Ambulatory Visit: Payer: Self-pay

## 2019-10-12 DIAGNOSIS — Z01812 Encounter for preprocedural laboratory examination: Secondary | ICD-10-CM | POA: Insufficient documentation

## 2019-10-12 DIAGNOSIS — Z20822 Contact with and (suspected) exposure to covid-19: Secondary | ICD-10-CM | POA: Diagnosis not present

## 2019-10-12 LAB — SARS CORONAVIRUS 2 (TAT 6-24 HRS): SARS Coronavirus 2: NEGATIVE

## 2019-10-16 ENCOUNTER — Ambulatory Visit
Admission: RE | Admit: 2019-10-16 | Discharge: 2019-10-16 | Disposition: A | Discharge status: Home / Self Care | Payer: Federal, State, Local not specified - PPO | Attending: Gastroenterology | Admitting: Gastroenterology

## 2019-10-16 ENCOUNTER — Ambulatory Visit: Payer: Federal, State, Local not specified - PPO | Admitting: Certified Registered"

## 2019-10-16 ENCOUNTER — Encounter: Payer: Self-pay | Admitting: Gastroenterology

## 2019-10-16 ENCOUNTER — Other Ambulatory Visit: Payer: Self-pay

## 2019-10-16 ENCOUNTER — Encounter
Admission: RE | Disposition: A | Discharge status: Home / Self Care | Payer: Self-pay | Source: Home / Self Care | Attending: Gastroenterology

## 2019-10-16 DIAGNOSIS — D649 Anemia, unspecified: Secondary | ICD-10-CM | POA: Diagnosis not present

## 2019-10-16 DIAGNOSIS — Z833 Family history of diabetes mellitus: Secondary | ICD-10-CM | POA: Diagnosis not present

## 2019-10-16 DIAGNOSIS — Z8249 Family history of ischemic heart disease and other diseases of the circulatory system: Secondary | ICD-10-CM | POA: Diagnosis not present

## 2019-10-16 DIAGNOSIS — Z808 Family history of malignant neoplasm of other organs or systems: Secondary | ICD-10-CM | POA: Diagnosis not present

## 2019-10-16 DIAGNOSIS — D123 Benign neoplasm of transverse colon: Secondary | ICD-10-CM | POA: Insufficient documentation

## 2019-10-16 DIAGNOSIS — Z803 Family history of malignant neoplasm of breast: Secondary | ICD-10-CM | POA: Insufficient documentation

## 2019-10-16 DIAGNOSIS — K635 Polyp of colon: Secondary | ICD-10-CM

## 2019-10-16 DIAGNOSIS — Z79899 Other long term (current) drug therapy: Secondary | ICD-10-CM | POA: Insufficient documentation

## 2019-10-16 DIAGNOSIS — F418 Other specified anxiety disorders: Secondary | ICD-10-CM | POA: Insufficient documentation

## 2019-10-16 DIAGNOSIS — Z1211 Encounter for screening for malignant neoplasm of colon: Secondary | ICD-10-CM

## 2019-10-16 DIAGNOSIS — R569 Unspecified convulsions: Secondary | ICD-10-CM | POA: Diagnosis not present

## 2019-10-16 HISTORY — PX: COLONOSCOPY WITH PROPOFOL: SHX5780

## 2019-10-16 HISTORY — DX: Other specified health status: Z78.9

## 2019-10-16 SURGERY — COLONOSCOPY WITH PROPOFOL
Anesthesia: General

## 2019-10-16 MED ORDER — LIDOCAINE 2% (20 MG/ML) 5 ML SYRINGE
INTRAMUSCULAR | Status: DC | PRN
  Administered 2019-10-16: 25 mg via INTRAVENOUS

## 2019-10-16 MED ORDER — MIDAZOLAM HCL 5 MG/5ML IJ SOLN
INTRAMUSCULAR | Status: DC | PRN
  Administered 2019-10-16: 2 mg via INTRAVENOUS

## 2019-10-16 MED ORDER — SODIUM CHLORIDE 0.9 % IV SOLN: INTRAVENOUS | Status: DC

## 2019-10-16 MED ORDER — PROPOFOL 500 MG/50ML IV EMUL
INTRAVENOUS | Status: DC | PRN
  Administered 2019-10-16: 120 ug/kg/min via INTRAVENOUS

## 2019-10-16 MED ORDER — PROPOFOL 10 MG/ML IV BOLUS
INTRAVENOUS | Status: DC | PRN
  Administered 2019-10-16: 30 mg via INTRAVENOUS
  Administered 2019-10-16: 70 mg via INTRAVENOUS

## 2019-10-16 NOTE — Op Note (Signed)
[facility] Gastroenterology Patient Name: Maria Buck Procedure Date: 10/16/2019 8:39 AM MRN: [id number] Account #: [id number] Date of Birth: Mar 16, 1966 Admit Type: Outpatient Age: 54 Room: [facility] ENDO ROOM 1 Gender: Female Note Status: Finalized Procedure:             Colonoscopy Indications:           Screening for colorectal malignant neoplasm, This is                         the patient's first colonoscopy Providers:             Lin Landsman MD, MD Medicines:             Monitored Anesthesia Care Complications:         No immediate complications. Estimated blood loss: None. Procedure:             Pre-Anesthesia Assessment:                        - Prior to the procedure, a History and Physical was                         performed, and patient medications and allergies were                         reviewed. The patient is competent. The risks and                         benefits of the procedure and the sedation options and                         risks were discussed with the patient. All questions                         were answered and informed consent was obtained.                         Patient identification and proposed procedure were                         verified by the physician, the nurse, the                         anesthesiologist, the anesthetist and the technician                         in the pre-procedure area in the procedure room in the                         endoscopy suite. Mental Status Examination: alert and                         oriented. Airway Examination: normal oropharyngeal                         airway and neck mobility. Respiratory Examination:                         clear to auscultation. CV Examination: normal.  Prophylactic Antibiotics: The patient does not require                         prophylactic antibiotics. Prior Anticoagulants: The                         patient has taken no  previous anticoagulant or                         antiplatelet agents. ASA Grade Assessment: II - A                         patient with mild systemic disease. After reviewing                         the risks and benefits, the patient was deemed in                         satisfactory condition to undergo the procedure. The                         anesthesia plan was to use monitored anesthesia care                         (MAC). Immediately prior to administration of                         medications, the patient was re-assessed for adequacy                         to receive sedatives. The heart rate, respiratory                         rate, oxygen saturations, blood pressure, adequacy of                         pulmonary ventilation, and response to care were                         monitored throughout the procedure. The physical                         status of the patient was re-assessed after the                         procedure.                        After obtaining informed consent, the colonoscope was                         passed under direct vision. Throughout the procedure,                         the patient's blood pressure, pulse, and oxygen                         saturations were monitored continuously. The  Colonoscope was introduced through the anus and                         advanced to the the cecum, identified by appendiceal                         orifice and ileocecal valve. The colonoscopy was                         performed without difficulty. The patient tolerated                         the procedure well. The quality of the bowel                         preparation was poor. Findings:      The perianal and digital rectal examinations were normal. Pertinent       negatives include normal sphincter tone and no palpable rectal lesions.      A 4 mm polyp was found in the transverse colon. The polyp was sessile.       The polyp  was removed with a cold snare. Resection and retrieval were       complete.      Extensive amounts of semi-liquid stool was found in the entire colon,       precluding visualization.      The retroflexed view of the distal rectum and anal verge was normal and       showed no anal or rectal abnormalities. Impression:            - Preparation of the colon was poor.                        - One 4 mm polyp in the transverse colon, removed with                         a cold snare. Resected and retrieved.                        - Stool in the entire examined colon.                        - The distal rectum and anal verge are normal on                         retroflexion view. Recommendation:        - Discharge patient to home (with escort).                        - Clear liquid diet today.                        - Continue present medications.                        - Await pathology results.                        - Repeat colonoscopy tomorrow with repeat prep if  patient agrees because the bowel preparation was                         suboptimal. Procedure Code(s):     --- Professional ---                        [phone], Colonoscopy, flexible; with removal of                         tumor(s), polyp(s), or other lesion(s) by snare                         technique Diagnosis Code(s):     --- Professional ---                        Z12.11, Encounter for screening for malignant neoplasm                         of colon                        K63.5, Polyp of colon CPT copyright 2019 American Medical Association. All rights reserved. The codes documented in this report are preliminary and upon coder review may  be revised to meet current compliance requirements. Dr. Ulyess Mort Lin Landsman MD, MD 10/16/2019 8:57:01 AM This report has been signed electronically. Number of Addenda: 0 Note Initiated On: 10/16/2019 8:39 AM Scope Withdrawal Time: 0 hours 6 minutes  19 seconds  Total Procedure Duration: 0 hours 10 minutes 23 seconds  Estimated Blood Loss:  Estimated blood loss: none.      [facility]

## 2019-10-16 NOTE — Anesthesia Preprocedure Evaluation (Signed)
Anesthesia Evaluation  Patient identified by MRN, date of birth, ID band Patient awake    Reviewed: Allergy & Precautions, NPO status , Patient's Chart, lab work & pertinent test results  History of Anesthesia Complications Negative for: history of anesthetic complications  Airway Mallampati: II       Dental   Pulmonary neg sleep apnea, neg COPD, Not current smoker,           Cardiovascular (-) hypertension(-) Past MI and (-) CHF (-) dysrhythmias (-) Valvular Problems/Murmurs     Neuro/Psych neg Seizures Anxiety    GI/Hepatic Neg liver ROS, neg GERD  ,  Endo/Other  neg diabetes  Renal/GU negative Renal ROS     Musculoskeletal   Abdominal   Peds  Hematology  (+) anemia ,   Anesthesia Other Findings   Reproductive/Obstetrics                             Anesthesia Physical Anesthesia Plan  ASA: II  Anesthesia Plan: General   Post-op Pain Management:    Induction: Intravenous  PONV Risk Score and Plan: 3 and Propofol infusion, TIVA and Treatment may vary due to age or medical condition  Airway Management Planned: Nasal Cannula  Additional Equipment:   Intra-op Plan:   Post-operative Plan:   Informed Consent: I have reviewed the patients History and Physical, chart, labs and discussed the procedure including the risks, benefits and alternatives for the proposed anesthesia with the patient or authorized representative who has indicated his/her understanding and acceptance.       Plan Discussed with:   Anesthesia Plan Comments:         Anesthesia Quick Evaluation

## 2019-10-16 NOTE — H&P (Signed)
Cephas Darby, MD [address]  [place], [place] [address]  Main: [phone]  Fax: [phone] Pager: [phone]  Primary Care Physician:  Elby Beck, FNP Primary Gastroenterologist:  Dr. Cephas Darby  Pre-Procedure History & Physical: HPI:  Maria Buck is a 54 y.o. female is here for an colonoscopy.   Past Medical History:  Diagnosis Date   Medical history non-contributory     Past Surgical History:  Procedure Laterality Date   NO PAST SURGERIES      Prior to Admission medications   Medication Sig Start Date End Date Taking? Authorizing Provider  meclizine (ANTIVERT) 25 MG tablet Take 1 tablet (25 mg total) by mouth 3 (three) times daily as needed for dizziness. Patient not taking: Reported on 09/26/2019 01/17/19   Donnamae Jude, MD    Allergies as of 09/26/2019   (No Known Allergies)    Family History  Problem Relation Age of Onset   Breast cancer Maternal Grandmother 85   Heart attack Maternal Grandmother    Diabetes Paternal Grandmother    Diabetes Father    Heart attack Father    Thyroid cancer Mother    Diabetes Sister    Thyroid cancer Sister    Diabetes Paternal Aunt    Heart attack Maternal Aunt    Heart attack Maternal Uncle    Thyroid cancer Maternal Aunt     Social History   Socioeconomic History   Marital status: Single    Spouse name: Not on file   Number of children: Not on file   Years of education: Not on file   Highest education level: Not on file  Occupational History   Not on file  Tobacco Use   Smoking status: Never Smoker   Smokeless tobacco: Never Used  Substance and Sexual Activity   Alcohol use: Yes    Comment: rare    Drug use: Never   Sexual activity: Yes  Other Topics Concern   Not on file  Social History Narrative   Not on file   Social Determinants of Health   Financial Resource Strain:    Difficulty of Paying Living Expenses:   Food Insecurity:    Worried About Ship broker in the Last Year:    Arboriculturist in the Last Year:   Transportation Needs:    Film/video editor (Medical):    Lack of Transportation (Non-Medical):   Physical Activity:    Days of Exercise per Week:    Minutes of Exercise per Session:   Stress:    Feeling of Stress :   Social Connections:    Frequency of Communication with Friends and Family:    Frequency of Social Gatherings with Friends and Family:    Attends Religious Services:    Active Member of Clubs or Organizations:    Attends Music therapist:    Marital Status:   Intimate Partner Violence:    Fear of Current or Ex-Partner:    Emotionally Abused:    Physically Abused:    Sexually Abused:     Review of Systems: See HPI, otherwise negative ROS  Physical Exam: BP (!) 131/103   Pulse (!) 57   Temp (!) 97.2 F (36.2 C) (Tympanic)   Resp 18   Ht 5\' 9"  (1.753 m)   Wt 185 lb (83.9 kg)   SpO2 100%   BMI 27.32 kg/m  General:   Alert,  pleasant and cooperative in NAD  Head:  Normocephalic and atraumatic. Neck:  Supple; no masses or thyromegaly. Lungs:  Clear throughout to auscultation.    Heart:  Regular rate and rhythm. Abdomen:  Soft, nontender and nondistended. Normal bowel sounds, without guarding, and without rebound.   Neurologic:  Alert and  oriented x4;  grossly normal neurologically.  Impression/Plan: Venetia Welford is here for an colonoscopy to be performed for colon cancer screening  Risks, benefits, limitations, and alternatives regarding  colonoscopy have been reviewed with the patient.  Questions have been answered.  All parties agreeable.   Sherri Sear, MD  10/16/2019, 8:30 AM

## 2019-10-16 NOTE — Transfer of Care (Signed)
Immediate Anesthesia Transfer of Care Note  Patient: Maria Buck  Procedure(s) Performed: COLONOSCOPY WITH PROPOFOL (N/A )  Patient Location: Endoscopy Unit  Anesthesia Type:General  Level of Consciousness: awake  Airway & Oxygen Therapy: Patient Spontanous Breathing  Post-op Assessment: Report given to RN and Post -op Vital signs reviewed and stable  Post vital signs: Reviewed  Last Vitals:  Vitals Value Taken Time  BP 106/67 10/16/19 0859  Temp    Pulse 69 10/16/19 0859  Resp 16 10/16/19 0859  SpO2 98 % 10/16/19 0859    Last Pain:  Vitals:   10/16/19 0823  TempSrc: Tympanic  PainSc: 0-No pain         Complications: No apparent anesthesia complications

## 2019-10-16 NOTE — Anesthesia Postprocedure Evaluation (Signed)
Anesthesia Post Note  Patient: Maria Buck  Procedure(s) Performed: COLONOSCOPY WITH PROPOFOL (N/A )  Patient location during evaluation: Endoscopy Anesthesia Type: General Level of consciousness: awake and alert Pain management: pain level controlled Vital Signs Assessment: post-procedure vital signs reviewed and stable Respiratory status: spontaneous breathing and respiratory function stable Cardiovascular status: stable Anesthetic complications: no     Last Vitals:  Vitals:   10/16/19 0850 10/16/19 0859  BP: 106/67 106/67  Pulse: 66 69  Resp: (!) 23 16  Temp: (!) 36.2 C   SpO2: 100% 98%    Last Pain:  Vitals:   10/16/19 0850  TempSrc: Tympanic  PainSc:                  Haeli Gerlich K

## 2019-10-17 ENCOUNTER — Encounter: Payer: Self-pay | Admitting: *Deleted

## 2019-10-17 LAB — SURGICAL PATHOLOGY

## 2019-10-18 ENCOUNTER — Encounter: Payer: Self-pay | Admitting: Gastroenterology

## 2019-11-12 DIAGNOSIS — M25461 Effusion, right knee: Secondary | ICD-10-CM | POA: Diagnosis not present

## 2019-11-13 ENCOUNTER — Emergency Department

## 2019-11-13 ENCOUNTER — Other Ambulatory Visit: Payer: Self-pay

## 2019-11-13 ENCOUNTER — Encounter: Payer: Self-pay | Admitting: Emergency Medicine

## 2019-11-13 ENCOUNTER — Telehealth: Payer: Self-pay

## 2019-11-13 ENCOUNTER — Emergency Department
Admission: EM | Admit: 2019-11-13 | Discharge: 2019-11-13 | Disposition: A | Discharge status: Home / Self Care | Attending: Emergency Medicine | Admitting: Emergency Medicine

## 2019-11-13 DIAGNOSIS — M179 Osteoarthritis of knee, unspecified: Secondary | ICD-10-CM | POA: Insufficient documentation

## 2019-11-13 DIAGNOSIS — M25561 Pain in right knee: Secondary | ICD-10-CM | POA: Diagnosis not present

## 2019-11-13 DIAGNOSIS — R600 Localized edema: Secondary | ICD-10-CM | POA: Diagnosis not present

## 2019-11-13 DIAGNOSIS — M7989 Other specified soft tissue disorders: Secondary | ICD-10-CM | POA: Insufficient documentation

## 2019-11-13 DIAGNOSIS — M1711 Unilateral primary osteoarthritis, right knee: Secondary | ICD-10-CM | POA: Diagnosis not present

## 2019-11-13 DIAGNOSIS — M7121 Synovial cyst of popliteal space [Baker], right knee: Secondary | ICD-10-CM | POA: Diagnosis not present

## 2019-11-13 DIAGNOSIS — M25461 Effusion, right knee: Secondary | ICD-10-CM

## 2019-11-13 MED ORDER — PREDNISONE 10 MG PO TABS
ORAL_TABLET | ORAL | 0 refills | Status: DC
Start: 2019-11-13 — End: 2020-04-17

## 2019-11-13 NOTE — Telephone Encounter (Signed)
Pt came to office today requesting an apt for R knee pain. Pt c/o R knee pain, swelling for 11 days. Pt reports she does exercise everyday and did have a spinning class for 3 days before the swelling and pain which was 11 days ago. Pt reports pain to be pressure but at times having sharp pains. Knee is swollen 1.5 to 2 times the size of other knee. Pt reports family hx of blood clots and was advised by her family that she should have it checked.  Pt has hx of knee swelling in the past but was given naproxen prior and this has always reduced the swelling and alleviated the pain within a day.  Pt tried naproxen and elevation for the last 11 days and there has not been any change. Pt also used crutches over the weekend to reduce further stress on the knee. Pt denies any SOB and no swollen veins are visible.  Pt reports [facility] but it was when they were closing and they said she should go to the ER for further evaluation for blood clot because they did not have the capability to test for clots there.   Advised pt she should go to the ER to rule out DVT. Pt agreed and said she would go to [facility] now.  Advised to f/u with office after ER visit. Pt verbalized understanding.

## 2019-11-13 NOTE — Telephone Encounter (Signed)
Per EMR, patient seen in ER, evaluated and treated.

## 2019-11-13 NOTE — ED Provider Notes (Signed)
[facility] Emergency Department Provider Note  ____________________________________________  Time seen: Approximately 1:53 PM  I have reviewed the triage vital signs and the nursing notes.   HISTORY  Chief Complaint Knee Pain    HPI Maria Buck is a 54 y.o. female that presents to the emergency department for evaluation of right knee pain and swelling for 12 days.  Patient states that knee pain and swelling started after she did a spin class.  Pain and swelling are to the front of her knee.  No specific trauma.  She had something similar in March after she was doing lunges and felt a pop.  In March, she saw urgent care and was started on naproxen, and symptoms have resolved.  After knee started swelling 12 days ago, she restarted the naproxen that she was originally prescribed in March.  Pain and swelling did seem to improve.  She then traveled to [place] and it seemed to be aggravated by doing stairs.  She continued then to rest and ice knee but swelling has not resolved.  She has taken Tylenol, naproxen.  She has tried ice and elevation.  She has been using crutches.  Past Medical History:  Diagnosis Date  . Medical history non-contributory     Patient Active Problem List   Diagnosis Date Noted  . Encounter for screening colonoscopy   . Hot flashes 01/17/2019  . Chest pain with low risk for cardiac etiology 07/28/2018  . Anxiety state 04/21/2016  . Intermittent vertigo 04/21/2016  . Megaloblastic anemia 04/21/2016    Past Surgical History:  Procedure Laterality Date  . COLONOSCOPY WITH PROPOFOL N/A 10/16/2019   Procedure: COLONOSCOPY WITH PROPOFOL;  Surgeon: Lin Landsman, MD;  Location: [facility] ENDOSCOPY;  Service: Gastroenterology;  Laterality: N/A;  . NO PAST SURGERIES      Prior to Admission medications   Medication Sig Start Date End Date Taking? Authorizing Provider  predniSONE (DELTASONE) 10 MG tablet Take 6 tablets on day 1, take 5  tablets on day 2, take 4 tablets on day 3, take 3 tablets on day 4, take 2 tablets on day 5, take 1 tablet on day 6 11/13/19   Laban Emperor, PA-C    Allergies Patient has no known allergies.  Family History  Problem Relation Age of Onset  . Breast cancer Maternal Grandmother 48  . Heart attack Maternal Grandmother   . Diabetes Paternal Grandmother   . Diabetes Father   . Heart attack Father   . Thyroid cancer Mother   . Diabetes Sister   . Thyroid cancer Sister   . Diabetes Paternal Aunt   . Heart attack Maternal Aunt   . Heart attack Maternal Uncle   . Thyroid cancer Maternal Aunt     Social History Social History   Tobacco Use  . Smoking status: Never Smoker  . Smokeless tobacco: Never Used  Substance Use Topics  . Alcohol use: Yes    Comment: rare   . Drug use: Never     Review of Systems  Constitutional: No fever/chills ENT: No upper respiratory complaints. Cardiovascular: No chest pain. Respiratory: No cough. No SOB. Gastrointestinal: No abdominal pain.  No nausea, no vomiting.  Musculoskeletal: Positive for knee pain. Skin: Negative for rash, abrasions, lacerations, ecchymosis. Neurological: Negative for headaches, numbness or tingling   ____________________________________________   PHYSICAL EXAM:  VITAL SIGNS: ED Triage Vitals [11/13/19 1258]  Enc Vitals Group     BP (!) 156/92     Pulse Rate 62  Resp 16     Temp 98.4 F (36.9 C)     Temp Source Oral     SpO2 100 %     Weight 199 lb (90.3 kg)     Height 5\' 9"  (1.753 m)     Head Circumference      Peak Flow      Pain Score 4     Pain Loc      Pain Edu?      Excl. in [place]?      Constitutional: Alert and oriented. Well appearing and in no acute distress. Eyes: Conjunctivae are normal. PERRL. EOMI. Head: Atraumatic. ENT:      Ears:      Nose: No congestion/rhinnorhea.      Mouth/Throat: Mucous membranes are moist.  Neck: No stridor.  Cardiovascular: Normal rate, regular rhythm.   Good peripheral circulation. Respiratory: Normal respiratory effort without tachypnea or retractions. Lungs CTAB. Good air entry to the bases with no decreased or absent breath sounds. Gastrointestinal: Bowel sounds 4 quadrants. Soft and nontender to palpation. No guarding or rigidity. No palpable masses. No distention.  Musculoskeletal: Full range of motion to all extremities. No gross deformities appreciated.  Swelling to the anterior right knee.  No overlying erythema.  Joint is not hot to touch.  Full range of motion of knee but with pain. Neurologic:  Normal speech and language. No gross focal neurologic deficits are appreciated.  Skin:  Skin is warm, dry and intact. No rash noted. Psychiatric: Mood and affect are normal. Speech and behavior are normal. Patient exhibits appropriate insight and judgement.   ____________________________________________   LABS (all labs ordered are listed, but only abnormal results are displayed)  Labs Reviewed - No data to display ____________________________________________  EKG   ____________________________________________  RADIOLOGY Robinette Haines, personally viewed and evaluated these images (plain radiographs) as part of my medical decision making, as well as reviewing the written report by the radiologist.  DG Knee Complete 4 Views Right  Result Date: 11/13/2019 CLINICAL DATA:  Pain and swelling EXAM: RIGHT KNEE - COMPLETE 4+ VIEW COMPARISON:  None. FINDINGS: Frontal, lateral, and bilateral oblique views were obtained. No evident fracture or dislocation. There is a sizable joint effusion. There is mild joint space narrowing medially. Other joint spaces appear normal. There is mild spurring in all compartments. IMPRESSION: Sizable joint effusion. No fracture or dislocation. Osteoarthritic change, primarily medially. Electronically Signed   By: Lowella Grip III M.D.   On: 11/13/2019 14:32     ____________________________________________    PROCEDURES  Procedure(s) performed:    Procedures    Medications - No data to display   ____________________________________________   INITIAL IMPRESSION / ASSESSMENT AND PLAN / ED COURSE  Pertinent labs & imaging results that were available during my care of the patient were reviewed by me and considered in my medical decision making (see chart for details).  Review of the [place] CSRS was performed in accordance of the [place] prior to dispensing any controlled drugs.   Patient's diagnosis is consistent with osteoarthritis and joint effusion.  X-ray consistent with effusion and osteoarthritis.  Ultrasound negative for DVT.  Knee brace was placed.  Patient has crutches.  Patient was given prednisone for inflammation.  Patient will be discharged home with prescriptions for prednisone. No hx of diabetes. Patient is to follow up with orthopedics as directed. Patient is given ED precautions to return to the ED for any worsening or new symptoms.   Maria Buck was evaluated  in Emergency Department on 11/13/2019 for the symptoms described in the history of present illness. She was evaluated in the context of the global COVID-19 pandemic, which necessitated consideration that the patient might be at risk for infection with the SARS-CoV-2 virus that causes COVID-19. Institutional protocols and algorithms that pertain to the evaluation of patients at risk for COVID-19 are in a state of rapid change based on information released by regulatory bodies including the CDC and federal and state organizations. These policies and algorithms were followed during the patient's care in the ED.  ____________________________________________  FINAL CLINICAL IMPRESSION(S) / ED DIAGNOSES  Final diagnoses:  Acute pain of right knee  Effusion of right knee  Osteoarthritis of right knee, unspecified osteoarthritis type      NEW MEDICATIONS STARTED DURING  THIS VISIT:  ED Discharge Orders         Ordered    predniSONE (DELTASONE) 10 MG tablet     11/13/19 1506              This chart was dictated using voice recognition software/Dragon. Despite best efforts to proofread, errors can occur which can change the meaning. Any change was purely unintentional.    Laban Emperor, PA-C 11/14/19 1515    Merlyn Lot, MD 11/14/19 1539

## 2019-11-13 NOTE — ED Notes (Signed)
See triage note   Presents with right knee pain and swelling   States she developed pain with swelling about 12 days ago  Denies any injury  Ambulates with slight limp d/t swelling painnis mainly lateral

## 2019-11-13 NOTE — ED Triage Notes (Signed)
Patient reports pain and swelling in right knee for the last 2 weeks. States she injured same knee in march, was seen at [facility] and took naproxen and iced knee for a week with improvement in symptoms. Denies any new injury. Hx of blood clots in family. No redness or warmth noted to leg. Swelling noted to front of knee and lower thigh. Patient had neoprene knee brace on upon arrival. Able to ambulate with slight limp.

## 2019-11-22 ENCOUNTER — Encounter: Payer: Self-pay | Admitting: Radiology

## 2020-04-01 DIAGNOSIS — W57XXXA Bitten or stung by nonvenomous insect and other nonvenomous arthropods, initial encounter: Secondary | ICD-10-CM | POA: Diagnosis not present

## 2020-04-01 DIAGNOSIS — S90862A Insect bite (nonvenomous), left foot, initial encounter: Secondary | ICD-10-CM | POA: Diagnosis not present

## 2020-04-17 ENCOUNTER — Encounter: Payer: Self-pay | Admitting: Family Medicine

## 2020-04-17 ENCOUNTER — Ambulatory Visit (INDEPENDENT_AMBULATORY_CARE_PROVIDER_SITE_OTHER): Payer: Federal, State, Local not specified - PPO | Admitting: Family Medicine

## 2020-04-17 ENCOUNTER — Other Ambulatory Visit: Payer: Self-pay

## 2020-04-17 VITALS — BP 130/80 | HR 99 | Temp 97.5°F | Ht 69.0 in | Wt 220.5 lb

## 2020-04-17 DIAGNOSIS — S83104A Unspecified dislocation of right knee, initial encounter: Secondary | ICD-10-CM | POA: Diagnosis not present

## 2020-04-17 DIAGNOSIS — M1711 Unilateral primary osteoarthritis, right knee: Secondary | ICD-10-CM

## 2020-04-17 NOTE — Progress Notes (Signed)
Cheskel Silverio T. Lerline Valdivia, MD, [place]  Primary Care and [place] at [facility] [place], [address]  Phone: [phone]  FAX: [phone]  Maria Buck - 54 y.o. female  MRN [id number]  Date of Birth: 08-27-1965  Date: 04/17/2020  PCP: Elby Beck, FNP  Referral: Elby Beck, FNP  Chief Complaint  Patient presents with  . Joint Swelling    Right    This visit occurred during the SARS-CoV-2 public health emergency.  Safety protocols were in place, including screening questions prior to the visit, additional usage of staff PPE, and extensive cleaning of exam room while observing appropriate contact time as indicated for disinfecting solutions.   Subjective:   Maria Buck is a 54 y.o. very pleasant female patient with Body mass index is 32.56 kg/m. who presents with the following:  6 months R knee effusion:   Was doing some HIIT training in March and was doing some lunges and felt something in her knee.  Has had some injuries in the past.   She took some nsaids, and previously her knee will calm down with some basic rest, NSAIDs and icing.  March back then did some lunges, and felt a pop.  Swelled after a couple of days.  Took some NSAIDS from MD, and then it did go down.  Waited a few weeks, and when went back to exercise she went back to working out.  Then stopped working out.   She has gone through several cycles of resting, NSAIDs, then return of symptoms.  Went to [employer].   - did have a steroid injection of corticosteroid, which only provided short length of relief.  Review of Systems is noted in the HPI, as appropriate   Objective:   BP 130/80   Pulse 99   Temp (!) 97.5 F (36.4 C) (Temporal)   Ht 5\' 9"  (1.753 m)   Wt 220 lb 8 oz (100 kg)   SpO2 98%   BMI 32.56 kg/m   Right knee: Full extension, flexion to 120.  Stable to varus and valgus stress.  No pain with  loading the medial lateral patellar facets.  She does have medial joint line tenderness.  No significant lateral joint line tenderness.  She does have pain with bounce home testing, McMurray's, as well as flexion pinch testing.  ACL and PCL are entirely intact.  Nontender throughout the tibia and fibula.  Radiology: CLINICAL DATA:  Pain and swelling  EXAM: RIGHT KNEE - COMPLETE 4+ VIEW  COMPARISON:  None.  FINDINGS: Frontal, lateral, and bilateral oblique views were obtained. No evident fracture or dislocation. There is a sizable joint effusion. There is mild joint space narrowing medially. Other joint spaces appear normal. There is mild spurring in all compartments.  IMPRESSION: Sizable joint effusion. No fracture or dislocation. Osteoarthritic change, primarily medially.   Electronically Signed   By: Lowella Grip III M.D.   On: 11/13/2019 14:32  Assessment and Plan:     ICD-10-CM   1. Acute traumatic internal derangement of right knee, initial encounter  S83.104A MR Knee Right Wo Contrast  2. Primary osteoarthritis of right knee  M17.11    Total encounter time: 30 minutes. This includes total time spent on the day of encounter.  Chart review, x-ray independent review, discussion of anatomy, and reviewing plans of care.  Failure of conservative measures for 6 months including failure of modification of working out, rest, icing, NSAIDs,  oral steroids, intra-articular steroid injection.  Obtain an MRI of the right knee to evaluate for meniscal tear, loose body, or other intra-articular derangement.  Disposition will entirely depend on additional imaging.  No orders of the defined types were placed in this encounter.  Medications Discontinued During This Encounter  Medication Reason  . predniSONE (DELTASONE) 10 MG tablet Completed Course   Orders Placed This Encounter  Procedures  . MR Knee Right Wo Contrast    Follow-up: No follow-ups on  file.  Signed,  Maud Deed. Joeann Steppe, MD   Outpatient Encounter Medications as of 04/17/2020  Medication Sig  . [DISCONTINUED] predniSONE (DELTASONE) 10 MG tablet Take 6 tablets on day 1, take 5 tablets on day 2, take 4 tablets on day 3, take 3 tablets on day 4, take 2 tablets on day 5, take 1 tablet on day 6   No facility-administered encounter medications on file as of 04/17/2020.

## 2020-04-18 ENCOUNTER — Telehealth: Payer: Self-pay | Admitting: Family Medicine

## 2020-04-18 NOTE — Telephone Encounter (Signed)
Last injection was in June 10th at [facility] in [place]  Pt agreed to injection on Monday 04/22/2020 - pt scheduled for 06/23/19 at 9:20a  How long does she need to wait for the Cortisone to clear before having a MRI? She has to wait a certain time frame between the injection and the MRI  As of now leaving MRI as scheduled for 11/30 - she feels that she will be okay until the 30th once she gets the injection.   Please advise, thanks.

## 2020-04-18 NOTE — Telephone Encounter (Signed)
Basically, there is no issue.  You would want to wait at least a few days after an injection, but there are no real rules.  A week would be sufficient.  Maria Buck, thanks so much for all of your hel

## 2020-04-18 NOTE — Telephone Encounter (Signed)
Can you help  The easiest way for her to move up her MRI is for her to call the imaging center directly. They should be able alter the timing.  Probably important to recognize strokes, cancers, and bad things always get the more urgent appointments.   When did she get her knee injection from [facility]? We do not have notes, and it would need to be at least 3 months prior - at least early August or earlier.  If timing is appropriate, then I can inject her knee on Monday.      (Note to myself:  she has been having knee pain for 6 months.)

## 2020-04-18 NOTE — Telephone Encounter (Signed)
Patient came into office and stated she was scheduled for an MRI 3 weeks away. 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 Pt stated she cannot be limping around like this till her appointment and wants to hear back ASAP. Please advise.

## 2020-04-21 NOTE — Progress Notes (Signed)
    Brightyn Mozer T. Zarayah Lanting, MD, [place]  Primary Care and [place] at [facility] [place], [address]  Phone: [phone]  FAX: [phone]  Maria Buck - 54 y.o. female  MRN [id number]  Date of Birth: February 20, 1966  Date: 04/22/2020  PCP: Elby Beck, FNP  Referral: Elby Beck, FNP  Chief Complaint  Patient presents with  . Knee Pain    Right/Injection    This visit occurred during the SARS-CoV-2 public health emergency.  Safety protocols were in place, including screening questions prior to the visit, additional usage of staff PPE, and extensive cleaning of exam room while observing appropriate contact time as indicated for disinfecting solutions.    She presents today in follow-up regarding her right knee pain.  She has actually been having some symptoms for about 6 months.  I saw her on April 17, 2020, and I had some concern for internal derangement, so ordered an MRI of her knee.  At that point I did not think she was really in all that pain on my exam.  She called again at the end of last week and was concerned about her pain, and she does work in [employer].  I asked her to come into the office, and we can do an intra-articular injection today, and hopefully this will help with her symptoms.  Her MRI is pending.  Aspiration/Injection Procedure Note Maria Buck 19-Sep-1965 Date of procedure: 04/22/2020  Procedure: Large Joint Aspiration / Injection of Knee, R Indications: Pain  Procedure Details Patient verbally consented to procedure. Risks (including potential rare risk of infection), benefits, and alternatives explained. Sterilely prepped with Chloraprep. Ethyl cholride used for anesthesia. 9 cc Lidocaine 1% mixed with 1 mL of Kenalog 40 mg injected using the anteromedial approach without difficulty. No complications with procedure and tolerated well. Patient had decreased pain  post-injection. Medication: 1 mL of Kenalog 40 mg   Signed,  Ramel Tobon T. Juanisha Bautch, MD

## 2020-04-22 ENCOUNTER — Ambulatory Visit: Payer: Federal, State, Local not specified - PPO | Admitting: Family Medicine

## 2020-04-22 ENCOUNTER — Encounter: Payer: Self-pay | Admitting: Family Medicine

## 2020-04-22 ENCOUNTER — Other Ambulatory Visit: Payer: Self-pay

## 2020-04-22 VITALS — BP 120/80 | HR 100 | Temp 97.4°F | Ht 69.0 in | Wt 228.5 lb

## 2020-04-22 DIAGNOSIS — Z23 Encounter for immunization: Secondary | ICD-10-CM

## 2020-04-22 DIAGNOSIS — S83104A Unspecified dislocation of right knee, initial encounter: Secondary | ICD-10-CM | POA: Diagnosis not present

## 2020-04-22 DIAGNOSIS — M1711 Unilateral primary osteoarthritis, right knee: Secondary | ICD-10-CM

## 2020-04-22 MED ORDER — TRIAMCINOLONE ACETONIDE 40 MG/ML IJ SUSP
40.0000 mg | Freq: Once | INTRAMUSCULAR | Status: AC
  Administered 2020-04-22: 40 mg via INTRA_ARTICULAR

## 2020-04-22 NOTE — Addendum Note (Signed)
Addended by: Carter Kitten on: 04/22/2020 09:44 AM   Modules accepted: Orders

## 2020-04-24 ENCOUNTER — Telehealth: Payer: Self-pay

## 2020-04-24 NOTE — Telephone Encounter (Signed)
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 Dorla dropped off a copy of an email that has some more information, placed it on cart to be brought back,

## 2020-04-26 NOTE — Telephone Encounter (Signed)
Letter written as instructed by Dr. Lorelei Pont.  Sent to patient's MyChart.  Marsela notified of this via telephone.

## 2020-04-26 NOTE — Telephone Encounter (Signed)
Maria Buck,   I never got the paper / email that she was talking about.  Can you help write this for her?  Yesterday was so busy that I forgot to do this.  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  "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

## 2020-05-07 ENCOUNTER — Other Ambulatory Visit: Payer: Self-pay

## 2020-05-07 ENCOUNTER — Ambulatory Visit
Admission: RE | Admit: 2020-05-07 | Discharge: 2020-05-07 | Disposition: A | Discharge status: Home / Self Care | Payer: Federal, State, Local not specified - PPO | Source: Ambulatory Visit | Attending: Family Medicine | Admitting: Family Medicine

## 2020-05-07 DIAGNOSIS — S83104A Unspecified dislocation of right knee, initial encounter: Secondary | ICD-10-CM | POA: Insufficient documentation

## 2020-05-07 DIAGNOSIS — M1711 Unilateral primary osteoarthritis, right knee: Secondary | ICD-10-CM | POA: Diagnosis not present

## 2020-05-07 DIAGNOSIS — S8991XA Unspecified injury of right lower leg, initial encounter: Secondary | ICD-10-CM | POA: Diagnosis not present

## 2020-05-07 DIAGNOSIS — R600 Localized edema: Secondary | ICD-10-CM | POA: Diagnosis not present

## 2020-05-07 DIAGNOSIS — M7121 Synovial cyst of popliteal space [Baker], right knee: Secondary | ICD-10-CM | POA: Diagnosis not present

## 2020-08-09 ENCOUNTER — Other Ambulatory Visit
Admission: RE | Admit: 2020-08-09 | Discharge: 2020-08-09 | Disposition: A | Discharge status: Home / Self Care | Payer: Self-pay | Source: Ambulatory Visit | Attending: Plastic Surgery | Admitting: Plastic Surgery

## 2020-08-09 DIAGNOSIS — Z20828 Contact with and (suspected) exposure to other viral communicable diseases: Secondary | ICD-10-CM | POA: Insufficient documentation

## 2020-08-09 DIAGNOSIS — Z20822 Contact with and (suspected) exposure to covid-19: Secondary | ICD-10-CM | POA: Insufficient documentation

## 2020-08-10 LAB — COVID-19 NAAT (PCR): COVID-19 NAAT (PCR): NEGATIVE

## 2020-08-10 LAB — COVID-19 PCR

## 2020-08-12 ENCOUNTER — Encounter: Payer: Self-pay | Admitting: Plastic Surgery

## 2020-08-13 ENCOUNTER — Encounter: Payer: Self-pay | Admitting: Plastic Surgery

## 2020-08-14 ENCOUNTER — Ambulatory Visit: Payer: Self-pay | Admitting: Anesthesiology

## 2020-08-14 ENCOUNTER — Ambulatory Visit
Admission: RE | Admit: 2020-08-14 | Discharge: 2020-08-14 | Disposition: A | Discharge status: Home / Self Care | Payer: Self-pay | Source: Ambulatory Visit | Attending: Plastic Surgery | Admitting: Plastic Surgery

## 2020-08-14 ENCOUNTER — Encounter
Admission: RE | Disposition: A | Discharge status: Home / Self Care | Payer: Self-pay | Source: Ambulatory Visit | Attending: Plastic Surgery

## 2020-08-14 DIAGNOSIS — Z411 Encounter for cosmetic surgery: Secondary | ICD-10-CM | POA: Insufficient documentation

## 2020-08-14 DIAGNOSIS — Y813 Surgical instruments, materials and general- and plastic-surgery devices (including sutures) associated with adverse incidents: Secondary | ICD-10-CM | POA: Insufficient documentation

## 2020-08-14 DIAGNOSIS — T8543XD Leakage of breast prosthesis and implant, subsequent encounter: Secondary | ICD-10-CM | POA: Insufficient documentation

## 2020-08-14 HISTORY — PX: PR REPLACEMENT TISSUE EXPANDER W/PERMANENT IMPLANT: 11970

## 2020-08-14 LAB — PREGNANCY, URINE: Preg Test,UR: NEGATIVE

## 2020-08-14 SURGERY — REMOVAL, TISSUE EXPANDER, BREAST, WITH IMPLANT INSERTION
Anesthesia: General | Site: Breast | Laterality: Bilateral | Wound class: Clean

## 2020-08-14 MED ORDER — OXYCODONE-ACETAMINOPHEN 5-325 MG PO TABS *I*
ORAL_TABLET | ORAL | Status: AC
Start: 2020-08-14 — End: 2020-08-14
  Filled 2020-08-14: qty 1

## 2020-08-14 MED ORDER — PROPOFOL 10 MG/ML IV EMUL (INTERMITTENT DOSING) WRAPPED *I*
INTRAVENOUS | Status: DC | PRN
Start: 2020-08-14 — End: 2020-08-14
  Administered 2020-08-14: 150 mg via INTRAVENOUS
  Administered 2020-08-14 (×2): 25 mg via INTRAVENOUS

## 2020-08-14 MED ORDER — SODIUM CHLORIDE BACTERIOSTATIC 0.9 % IJ SOLN WRAPPED  *I*
INTRAMUSCULAR | Status: DC | PRN
Start: 2020-08-14 — End: 2020-08-14
  Administered 2020-08-14: 60 mL

## 2020-08-14 MED ORDER — EPHEDRINE 5MG/ML IN NS IV/IJ *WRAPPED*
INTRAMUSCULAR | Status: DC | PRN
Start: 2020-08-14 — End: 2020-08-14
  Administered 2020-08-14: 5 mg via INTRAVENOUS

## 2020-08-14 MED ORDER — HYDROMORPHONE HCL PF 1 MG/ML IJ SOLN *WRAPPED*
0.5000 mg | INTRAMUSCULAR | Status: DC | PRN
Start: 2020-08-14 — End: 2020-08-14

## 2020-08-14 MED ORDER — PROPOFOL 10 MG/ML IV EMUL (INTERMITTENT DOSING) WRAPPED *I*
INTRAVENOUS | Status: AC
Start: 2020-08-14 — End: 2020-08-14
  Filled 2020-08-14: qty 20

## 2020-08-14 MED ORDER — FENTANYL CITRATE 50 MCG/ML IJ SOLN *WRAPPED*
INTRAMUSCULAR | Status: AC
Start: 2020-08-14 — End: 2020-08-14
  Filled 2020-08-14: qty 2

## 2020-08-14 MED ORDER — BUPIVACAINE-EPINEPHRINE 0.25 % IJ SOLUTION *WRAPPED*
INTRAMUSCULAR | Status: AC
Start: 2020-08-14 — End: 2020-08-14
  Filled 2020-08-14: qty 30

## 2020-08-14 MED ORDER — MIDAZOLAM HCL 1 MG/ML IJ SOLN *I* WRAPPED
INTRAMUSCULAR | Status: DC | PRN
Start: 2020-08-14 — End: 2020-08-14
  Administered 2020-08-14: 2 mg via INTRAVENOUS

## 2020-08-14 MED ORDER — LIDOCAINE HCL 2 % (PF) IJ SOLN *I*
INTRAMUSCULAR | Status: AC
Start: 2020-08-14 — End: 2020-08-14
  Filled 2020-08-14: qty 5

## 2020-08-14 MED ORDER — CEFAZOLIN 2000 MG IN STERILE WATER 20ML SYRINGE *I*
2000.0000 mg | PREFILLED_SYRINGE | Freq: Once | INTRAVENOUS | Status: AC
Start: 2020-08-14 — End: 2020-08-14
  Administered 2020-08-14: 2000 mg via INTRAVENOUS

## 2020-08-14 MED ORDER — HYDROCODONE-ACETAMINOPHEN 5-325 MG PO TABS *I*
2.0000 | ORAL_TABLET | ORAL | Status: DC | PRN
Start: 2020-08-14 — End: 2020-08-14

## 2020-08-14 MED ORDER — DEXAMETHASONE SODIUM PHOSPHATE 4 MG/ML INJ SOLN *WRAPPED*
INTRAMUSCULAR | Status: DC | PRN
Start: 2020-08-14 — End: 2020-08-14
  Administered 2020-08-14: 4 mg via INTRAVENOUS

## 2020-08-14 MED ORDER — ACETAMINOPHEN 325 MG PO TABS *I*
650.0000 mg | ORAL_TABLET | ORAL | Status: DC | PRN
Start: 2020-08-14 — End: 2020-08-14

## 2020-08-14 MED ORDER — SCOPOLAMINE BASE 1.5 MG TD PT72 *I*
MEDICATED_PATCH | TRANSDERMAL | Status: AC
Start: 2020-08-14 — End: 2020-08-14
  Filled 2020-08-14: qty 1

## 2020-08-14 MED ORDER — SCOPOLAMINE BASE 1.5 MG TD PT72 *I*
1.0000 | MEDICATED_PATCH | TRANSDERMAL | Status: DC
Start: 2020-08-14 — End: 2020-08-14
  Administered 2020-08-14: 1 via TRANSDERMAL

## 2020-08-14 MED ORDER — ONDANSETRON HCL 2 MG/ML IV SOLN *I*
INTRAMUSCULAR | Status: DC | PRN
Start: 2020-08-14 — End: 2020-08-14
  Administered 2020-08-14: 4 mg via INTRAVENOUS

## 2020-08-14 MED ORDER — EPHEDRINE 5MG/ML IN NS IV/IJ *WRAPPED*
INTRAMUSCULAR | Status: AC
Start: 2020-08-14 — End: 2020-08-14
  Filled 2020-08-14: qty 5

## 2020-08-14 MED ORDER — DIAZEPAM 5 MG PO TABS *I*
5.0000 mg | ORAL_TABLET | Freq: Four times a day (QID) | ORAL | Status: DC | PRN
Start: 2020-08-14 — End: 2020-08-14

## 2020-08-14 MED ORDER — LACTATED RINGERS IV SOLN *I*
200.0000 mL | INTRAVENOUS | Status: DC
Start: 2020-08-14 — End: 2020-08-14

## 2020-08-14 MED ORDER — GABAPENTIN 300 MG PO CAPSULE *I*
300.0000 mg | ORAL_CAPSULE | Freq: Once | ORAL | Status: AC
Start: 2020-08-14 — End: 2020-08-14
  Administered 2020-08-14: 300 mg via ORAL

## 2020-08-14 MED ORDER — ONDANSETRON HCL 2 MG/ML IV SOLN *I*
4.0000 mg | Freq: Three times a day (TID) | INTRAMUSCULAR | Status: DC | PRN
Start: 2020-08-14 — End: 2020-08-14

## 2020-08-14 MED ORDER — OXYCODONE-ACETAMINOPHEN 5-325 MG PO TABS *I*
2.0000 | ORAL_TABLET | ORAL | Status: DC | PRN
Start: 2020-08-14 — End: 2020-08-14

## 2020-08-14 MED ORDER — METOCLOPRAMIDE HCL 5 MG/ML IJ SOLN *I*
INTRAMUSCULAR | Status: DC | PRN
Start: 2020-08-14 — End: 2020-08-14
  Administered 2020-08-14: 10 mg via INTRAVENOUS

## 2020-08-14 MED ORDER — OXYCODONE-ACETAMINOPHEN 5-325 MG PO TABS *I*
1.0000 | ORAL_TABLET | ORAL | Status: DC | PRN
Start: 2020-08-14 — End: 2020-08-14
  Administered 2020-08-14: 1 via ORAL

## 2020-08-14 MED ORDER — ONDANSETRON HCL 2 MG/ML IV SOLN *I*
INTRAMUSCULAR | Status: DC | PRN
Start: 2020-08-14 — End: 2020-08-14

## 2020-08-14 MED ORDER — PHENYLEPHRINE 100 MCG/ML IN NS 10 ML *WRAPPED*
INTRAMUSCULAR | Status: DC | PRN
Start: 2020-08-14 — End: 2020-08-14
  Administered 2020-08-14: 100 ug via INTRAVENOUS

## 2020-08-14 MED ORDER — SODIUM CHLORIDE 0.9 % IR SOLN *I*
Freq: Once | Status: DC
Start: 2020-08-14 — End: 2020-08-14
  Filled 2020-08-14: qty 2

## 2020-08-14 MED ORDER — SODIUM CHLORIDE BACTERIOSTATIC 0.9 % IJ SOLN WRAPPED  *I*
INTRAMUSCULAR | Status: AC
Start: 2020-08-14 — End: 2020-08-14
  Filled 2020-08-14: qty 60

## 2020-08-14 MED ORDER — FENTANYL CITRATE 50 MCG/ML IJ SOLN *WRAPPED*
25.0000 ug | INTRAMUSCULAR | Status: DC | PRN
Start: 2020-08-14 — End: 2020-08-14

## 2020-08-14 MED ORDER — ONDANSETRON HCL 2 MG/ML IV SOLN *I*
INTRAMUSCULAR | Status: AC
Start: 2020-08-14 — End: 2020-08-14
  Filled 2020-08-14: qty 2

## 2020-08-14 MED ORDER — FENTANYL CITRATE 50 MCG/ML IJ SOLN *WRAPPED*
INTRAMUSCULAR | Status: DC | PRN
Start: 2020-08-14 — End: 2020-08-14
  Administered 2020-08-14: 25 ug via INTRAVENOUS
  Administered 2020-08-14: 50 ug via INTRAVENOUS
  Administered 2020-08-14: 25 ug via INTRAVENOUS

## 2020-08-14 MED ORDER — LACTATED RINGERS IV SOLN *I*
100.0000 mL/h | INTRAVENOUS | Status: DC
Start: 2020-08-14 — End: 2020-08-14

## 2020-08-14 MED ORDER — GABAPENTIN 300 MG PO CAPSULE *I*
ORAL_CAPSULE | ORAL | Status: AC
Start: 2020-08-14 — End: 2020-08-14
  Filled 2020-08-14: qty 1

## 2020-08-14 MED ORDER — CEFAZOLIN 2000 MG IN STERILE WATER 20ML SYRINGE *I*
PREFILLED_SYRINGE | INTRAVENOUS | Status: AC
Start: 2020-08-14 — End: 2020-08-14
  Filled 2020-08-14: qty 20

## 2020-08-14 MED ORDER — METOCLOPRAMIDE HCL 5 MG/ML IJ SOLN *I*
INTRAMUSCULAR | Status: AC
Start: 2020-08-14 — End: 2020-08-14
  Filled 2020-08-14: qty 2

## 2020-08-14 MED ORDER — DEXAMETHASONE SODIUM PHOSPHATE 10 MG/ML IJ SOLN *I*
INTRAMUSCULAR | Status: AC
Start: 2020-08-14 — End: 2020-08-14
  Filled 2020-08-14: qty 1

## 2020-08-14 MED ORDER — MIDAZOLAM HCL 1 MG/ML IJ SOLN *I* WRAPPED
INTRAMUSCULAR | Status: AC
Start: 2020-08-14 — End: 2020-08-14
  Filled 2020-08-14: qty 2

## 2020-08-14 MED ORDER — BUPIVACAINE-EPINEPHRINE 0.25 % IJ SOLUTION *WRAPPED*
INTRAMUSCULAR | Status: DC | PRN
Start: 2020-08-14 — End: 2020-08-14
  Administered 2020-08-14: 30 mL via SUBCUTANEOUS

## 2020-08-14 MED ORDER — GENTAMICIN SULFATE 40 MG/ML IJ SOLN *I*
Freq: Once | INTRAMUSCULAR | Status: DC
Start: 2020-08-14 — End: 2020-08-14
  Filled 2020-08-14: qty 10

## 2020-08-14 MED ORDER — LACTATED RINGERS IV SOLN *I*
60.0000 mL/h | INTRAVENOUS | Status: DC
Start: 2020-08-14 — End: 2020-08-14
  Administered 2020-08-14: 60 mL/h via INTRAVENOUS

## 2020-08-14 MED ORDER — PHENYLEPHRINE 100 MCG/ML IN NS 10 ML *WRAPPED*
INTRAMUSCULAR | Status: AC
Start: 2020-08-14 — End: 2020-08-14
  Filled 2020-08-14: qty 10

## 2020-08-14 MED ORDER — LIDOCAINE HCL 2 % IJ SOLN *I*
INTRAMUSCULAR | Status: DC | PRN
Start: 2020-08-14 — End: 2020-08-14
  Administered 2020-08-14: 100 mg via INTRAVENOUS

## 2020-08-14 MED ORDER — ONDANSETRON HCL 2 MG/ML IV SOLN *I*
4.0000 mg | Freq: Once | INTRAMUSCULAR | Status: DC | PRN
Start: 2020-08-14 — End: 2020-08-14

## 2020-08-14 MED ORDER — HYDROCODONE-ACETAMINOPHEN 5-325 MG PO TABS *I*
1.0000 | ORAL_TABLET | ORAL | Status: DC | PRN
Start: 2020-08-14 — End: 2020-08-14

## 2020-08-14 SURGICAL SUPPLY — 32 items
APPLICATOR CHLORAPREP STER  ORANGE 26ML (Supply) ×6 IMPLANT
BAG DECANTER/SPIKES (Supply) ×2
BLADE GUARDED EXTN 6.5IN (Supply) ×3 IMPLANT
BLADE SURG CARBON STEEL #15 STER (Supply) ×3 IMPLANT
BLADE SURG CARBON STEEL #15 STER REUSE HNDL (Supply) ×1 IMPLANT
CLOSURE STERI-STRIP REINF .5 X 4IN LF (Dressing) ×3 IMPLANT
DISPENSER FLD PHAR ACC SPIK DECANT W/ FLTR VENT (Supply) ×1 IMPLANT
DRAPE STERI TOWEL 1010 NL (Drape) ×6 IMPLANT
DRESSING TEGADERM W/LABEL 2 3/8 X 2 3/4 (Dressing) ×12 IMPLANT
DRESSING TEGADERM W/LABEL 4 X 4 3/4IN (Dressing) ×6 IMPLANT
DRESSING XEROFORM STR 1 IN X8 (Dressing) ×5 IMPLANT
ELECTRODE ADULT POLYHESIVE PAT RETURN (Supply) ×3 IMPLANT
GARMENT COMPR M CALF BLU SEQ SCD REPROC FOR DVT VASO-FORC (Supply) ×1 IMPLANT
GAUZE SPONGE AVANT 4"X4" 4PLY STRL LF (Supply) ×1
GAUZE SPONGE AVANT 4INX4IN 4PLY STRL LF (Supply) ×2 IMPLANT
GLOVE BIOGEL PI MICRO IND UNDER SZ 7.0 LF (Glove) ×5 IMPLANT
GLOVE SURG BIOGEL PI ULTRATOUCH SZ 6.5 (Glove) ×11 IMPLANT
HANDLE YANK SUCT OPEN TIP (Supply) ×3 IMPLANT
MANIFOLD ULTRA CART DORNOCH (Supply) ×3 IMPLANT
Natrelle Saline Filled Breast Implants ref # : 68- ×1 IMPLANT
Natrelle Saline Filled Breast Implants ref# : 68-3 ×2 IMPLANT
PACK CUSTOM DOUBLE BASIN (Pack) ×3 IMPLANT
PACK GENERAL PLASTIC (Pack) ×3 IMPLANT
SLEEVE SCD DVT MED (Supply) ×2
SOL H2O IRRIG 500ML STERILE BTL (Solution) ×1 IMPLANT
SOL H2O IRRIG STER 500ML BTL (Solution) ×2
SOL SOD CHL IV .9PCT 1000ML BAG (Drug) ×2 IMPLANT
SUTR VICRYL 4-0 PS-2 CT 18 IN UNDY (Suture) ×6 IMPLANT
SYRINGE CONTROL 10CC (Syringe) ×2 IMPLANT
SYRINGE LUERLOCK 50 ML (Syringe)
SYRINGE LUERLOCK 50 ML INDIVIDUAL WRAP (Syringe) ×2 IMPLANT
TUBE IV D AMORE V1501 (Supply) ×2 IMPLANT

## 2020-08-14 NOTE — Anesthesia Case Conclusion (Signed)
CASE CONCLUSION  Emergence  Actions:  Suctioned  Criteria Used for Airway Removal:  Adequate Tv & RR  Assessment:  Routine  Transport  Position:  Upright  Patient Condition on Handoff  Level of Consciousness:  Moderately sedated  Patient Condition:  Stable  Handoff Report to:  RN

## 2020-08-14 NOTE — INTERIM OP NOTE (Signed)
Interim Op Note (Surgical Log ID: [id number])       Date of Surgery: 08/14/2020       Surgeons: Moishe Spice) and Role:     * [name], MD - Primary   Assistants:         Pre-op Diagnosis: Pre-Op Diagnosis Codes:     * Breast implant rupture, subsequent encounter [T85.43XD]       Post-op Diagnosis: Post-Op Diagnosis Codes:     * Breast implant rupture, subsequent encounter [T85.43XD]       Procedure(s) Performed: Procedure(s) (LRB):  REMOVAL, TISSUE EXPANDER, BREAST, WITH IMPLANT INSERTION, POSSIBLE CAPSULOTOMY (Bilateral)       Anesthesia Type: General        Fluid Totals: I/O this shift:  03/09 0700 - 03/09 1459  In: 1720 (25.6 mL/kg) [P.O.:720; I.V.:1000]  Out: 10 (0.1 mL/kg) [Blood:10]  Net: 1710  Weight: 67.1 kg        Estimated Blood Loss: 10 mL       Specimens to Pathology:  * No specimens in log *       Temporary Implants:        Packing:                 Patient Condition: good       Findings (Including unexpected complications): Contracted pocket on right     Signed:  [name], MD  on 08/14/2020 at 2:13 PM

## 2020-08-14 NOTE — Anesthesia Procedure Notes (Signed)
---------------------------------------------------------------------------------------------------------------------------------------    AIRWAY   GENERAL INFORMATION AND STAFF    Patient location during procedure: OR       Date of Procedure: 08/14/2020 8:22 AM  CONDITION PRIOR TO MANIPULATION     Current Airway/Neck Condition:  Normal        For more airway physical exam details, see Anesthesia PreOp Evaluation  AIRWAY METHOD     Patient Position:  Sniffing    Preoxygenated: yes      Induction: Routine    Mask Difficulty Assessment:  1 - vent by mask      Mask NMB: 1 - vent by mask    Number of Attempts at Approach:  1  FINAL AIRWAY DETAILS    Final Airway Type:  LMA    Final LMA: Unique    LMA Size: 3  ----------------------------------------------------------------------------------------------------------------------------------------

## 2020-08-14 NOTE — Anesthesia Preprocedure Evaluation (Addendum)
Anesthesia Pre-operative History and Physical for Brianna Gill    Highlighted Issues for this Procedure:  55 y.o. female with Breast implant rupture, subsequent encounter (T85.43XD) presenting for Procedure(s):  REMOVAL, TISSUE EXPANDER, BREAST, WITH IMPLANT INSERTION, POSSIBLE CAPSULOTOMY by Surgeon(s):  [name], MD scheduled for 110 minutes. She does not have a problem list on file.She has no past medical history on file.        .  .  Anesthesia Evaluation Information Source: patient, records     ANESTHESIA HISTORY     Denies anesthesia history    GENERAL     Denies general issues    HEENT     Denies HEENT issues PULMONARY     Denies pulmonary issues    CARDIOVASCULAR     Denies cardiovascular issues    GI/HEPATIC/RENAL        Denies GI/hepatic/renal issues NEURO/PSYCH     Denies neuro/psych issues    ENDO/OTHER     Denies endo issues    HEMATOLOGIC     Denies hematologic issues         Physical Exam    Airway            Mouth opening: normal            Mallampati: III            TM distance (fb): >3 FB            Neck ROM: full            Comment: Small oral space            Airway Impression: easy  Dental   Normal Exam   Cardiovascular  Normal Exam      Neurologic    Normal Exam    General Survey    Normal Exam   Pulmonary   Normal Exam    Mental Status   Normal Exam    Operative Site    Normal Exam       ________________________________________________________________________  PLAN  ASA Score  2  Anesthetic Plan general       Induction (routine IV) General Anesthesia/Sedation Maintenance Plan (inhaled agents);  Airway Manipulation (direct laryngoscopy); Airway (LMA and cuffed ETT); Line ( use current access); Monitoring (standard ASA); Positioning (supine); PONV Plan (dexamethasone and ondansetron); PostOp (PACU)    Informed Consent     Risks:        Risks discussed were commensurate with the plan listed above with the following specific points: N/V, aspiration, hypotension and infection, Damage to:  allergic Rx, unexpected serious injury and death.    Anesthetic Consent:         Anesthetic plan (and risks as noted above) were discussed with patient    Plan also discussed with team members including:       CRNA    Responsible Anesthesia Provider Attestation:  I attest that the patient or proxy understands and accepts the risks and benefits of the anesthesia plan. I also attest that I have personally performed a pre-anesthetic examination and evaluation, and prescribed the anesthetic plan for this particular location within 48 hours prior to the anesthetic as documented. Oren Section, DO  08/14/20, 6:19 AM

## 2020-08-14 NOTE — H&P (Signed)
PATIENT:   Brianna Gill, Brianna Gill  MR #:  [id number]   CSN:  [id number] DOB:  1966-04-06   ATTENDING:  Vonna Kotyk, MD AGE: 55    ADMITTED:  08/14/2020       The patient is a 55 year old status post saline submuscular augmentation by myself approximately 15 years ago, who returns with complaints of a right deflation noted around Thanksgiving and has a desire for replacement of her implants.  These were Natrelle 330 mL implants filled to 360 mL on the right and 340 mL on the left.  The patient states that the implants were "great until her deflation."  She has no history of trauma.  Her most recent mammogram was performed on  May 30, 2020.  The patient states that she wanted her saline implants replaced with saline implants and that she still loves them.  She wears Victoria's secret 60 D.  She feels that her breasts were fairly symmetric prior to the deflation.  Her family history continues to be negative for breast cancer.    PAST MEDICAL HISTORY:  She is 5 feet 4 inches, 145 pounds.  Current medical problems include infertility and self-diagnosed reflux disease.  She has no known medical allergies.  She takes no prescription medications, but is on Nexium over the counter 10 mg a day.  She has been off all aspirin and NSAIDs now for 2 weeks.       Her prior surgeries and hospitalization were for kidney stone removal in 2008, augmentation in 2007, jaw surgery in 1995, Lasik in 2006 and eyelid surgery in 2021.        Mammogram is current.    SOCIAL HISTORY:  She is a nonsmoker.  She drinks alcohol on a social basis.  She is a married mother of 2.    FAMILY HISTORY:  Positive for atrial fibrillation in 32 year old mother, but otherwise noncontributory.    REVIEW OF SYSTEMS:  Positive for kidney stones, an approximately 10-pound weight gain through COVID.  She has no history of blood clots.  No history of miscarriages.  No diabetes or thyroid problems.  No anemia.  She has very irregular periods, but eats a normal  diet.    PHYSICAL EXAMINATION:  GENERAL:  The patient is a very pleasant, healthy-appearing woman looking younger than her stated age. HEENT: Head and neck shows her pupils are equal and reactive.  Conjunctiva pink.  Sclerae nonicteric.  Dentition is in good repair.  Her pharynx is benign.  Neck supple, without submandibular or cervical adenopathy.  She has no thyromegaly or nodules.  LUNGS:  Clear throughout.  CARDIAC:  Regular rate and rhythm without a murmur.  BACK:  No vertebral or CVA tenderness.  She has no chest wall tenderness on AP compression over the sternum or lateral compression of the ribs.  BREAST:  Asymmetry with the right breast smaller than the left.  The implant does not appear to be deflated at this time.  There is no evidence of disease.  She has no axillary adenopathy.  Her inframammary incisions are well healed.  She has no nipple discharge.  ABDOMEN:  Normal bowel sounds without tenderness on deep palpation.  EXTREMITIES:  No clubbing, cyanosis, or edema.    IMPRESSION:  Prior saline augmentation with a right implant deflation in at patient who requests a simple direct replacement of her previous augmentation with saline implants, same size, style and fill volumes.  The risks of the procedures are well known to the patient, but  include, and are not limited to, bleeding, infection, a slightly longer inframammary scar, numbness, asymmetry, rippling, rupture of the implants, continued changes in her breasts with her age, her weight.  The option to replace her implants with silicone gel implants or remove them completely without replacement were all discussed with the patient as well as the continued need for breast cancer surveillance through her self-breast exam and mammograms.  In addition, considerable time was spent on the preparation for surgery form reviewing a high-protein, high-fiber diet supplemented with a multivitamin vitamin C.  We reviewed the [facility] mnemonic to lower her risk for  blood clots, her antibiotic skin prep, her postoperative clothing, and her medications, Keflex for 7 days and Percocet.  She will receive a scopolamine patch for history of postop nausea and vomiting.  We will proceed with her surgery.  Her COVID test is reportedly negative.  I will mark her in the preoperative holding area in the morning.             ______________________________  Vonna Kotyk, MD    KD/MODL  DD:  08/13/2020 15:45:37  DT:  08/13/2020 17:29:07  Job #:  [id number]    cc:

## 2020-08-14 NOTE — Interval H&P Note (Signed)
UPDATES TO PATIENT'S CONDITION on the DAY OF SURGERY/PROCEDURE    I. Updates to Patient's Condition (to be completed by a provider privileged to complete a H&P, following reassessment of the patient by the provider):    Day of Surgery/Procedure Update:  History  History reviewed and no change    Physical  Physical exam updated and no change            II. Procedure Readiness   I have reviewed the patient's H&P and updated condition. By completing and signing this form, I attest that this patient is ready for surgery/procedure.    III. Attestation   I have reviewed the updated information regarding the patient's condition and it is appropriate to proceed with the planned surgery/procedure.    I have reminded [name] that COVID-19 is still present in our community. She was advised that [facility] and its affiliates have made deliberate and widespread changes to policies and procedures, consistent with applicable directives, in order to reduce the risk of exposure in our facilities.     I further explained and [name] understands that given the communicability of the SARS CoV2 coronavirus, there remains a small but real risk of contracting the disease while receiving perioperative care - even with stringent preventive measures in place.   [name] understands the potential consequences of COVID-19 disease as it relates to their planned procedure and anticipated postoperative course.      The patient and I have considered and discussed the relative risks and benefits of proceeding with his/her surgery - both in terms of the procedure itself, and also in the context of the ongoing pandemic.  [name] wishes to proceed with the procedure.     Brianna Severe, MD as of 8:03 AM 08/14/2020

## 2020-08-14 NOTE — Op Note (Signed)
PATIENTARDYN, FORGE  MR #:  [id number]   CSN:  [id number] DOB:  1965-11-13    AGE:  54     SURGEON:  Vonna Kotyk, MD  CO-SURGEON:    ASSISTANT:    SURGERY DATE:  08/14/2020    PREOPERATIVE DIAGNOSIS:  Right, deflated saline implant, bilateral saline implants.    POSTOPERATIVE DIAGNOSIS:  Right, deflated saline implant, bilateral saline implants.    OPERATIONS PERFORMED:  Bilateral explantation with reaugmentation using Natrelle style 68 MP 330 cc implant insufflated to 360 cc on the right and 340 cc on the left.    ANESTHESIA:  General anesthesia with placement of an LMA.    INDICATIONS:  The patient is a 56 year old woman, known to me from previous breast augmentation, roughly 15 years ago, who noted a deflation on the right side and due to COVID issues and her desire to return to her original surgeon has had a long-standing right deflation.  She was marked in the preoperative holding area in the standing position with the plan for removal of the implants and re-augmentation with the same.  She is brought to the operating room for correction of the above.    OPERATIVE PROCEDURE:  After successful transfer to the OR table, she is comfortably positioned with her arms up perpendicular, gently wrapped in arm boards with wide blue towels at the wrist.  She underwent successful general anesthesia with placement of an LMA, followed by a standard prep from her chin to her umbilicus, tabletop to tabletop, and draping in the usual sterile fashion.  The operation was begun with a time-out and confirmation of the planned procedure followed by application of Tegaderms to each nipple areolar complex.  The 3 cm inframammary incision was excised and extended an additional cm starting on the left side.  The dissection was carried down through the skin, subcutaneous tissue and superficial fascia to identify her capsule which was incised on cutting cautery.  The implant atraumatically removed and full inspection of the  implant revealed no abnormalities.  It was removed from the table and sent to sterile supply for decontamination.  Inspection of the pocket revealed no areas of concern.  Bimanual palpation of the breast tissue revealed no abnormalities.  The pocket was irrigated with saline until clear.  Followed by triple antibiotic irrigation, the left saline implant was inspected and found to be intact.  It was rinsed in a double antibiotic class of gentamicin and Ancef and placed atraumatically into the left submuscular space without incident.  The implant volume on the left was then restored to 340 cc with a nice augmentation.  Attention was directed toward the right side where a similar procedure was undertaken; however, this implant was removed and contracted.  Inspection of the implant revealed no gross tissue within the valve; however, a crease fold appeared to be leaking the saline.  Inspection of the pocket revealed a marked superior and lateral contraction requiring re-opening of the pocket under direct headlight illumination using cutting cautery.  The pocket was then copiously irrigated until clear with normal saline followed by double antibiotic irrigation and placement of the right saline implant, which was also rinsed in a double antibiotic bath.  Inflation of the implant to 360 cc as per its previous volume revealed some capsular contractile asymmetries.  These were released under direct headlight illumination with care being taken to protect her implant.  She was sat in a bolt upright position multiple times and once the pocket  dissection was completed, the mild asymmetry was accepted as part of her muscular contracture and soft-tissue contracture.  She was returned to the supine position.  The insufflating ports were removed.  The valve was digitally seated and a layered closure of the capsule was completed with a running 4-0 Vicryl.  A solution of dilute bupivacaine with epi was instilled into the inferolateral  chest wall incision for postop comfort.  Completion of this dermal closure was achieved with a running 4-0 Vicryl subcuticular stitch.  The epidermis was supported with Steri-Strips.  The Tegaderm was removed from the nipple areolar complex and the dressing was completed with Xeroform gauze followed by sterile gauze secured with Tegaderms.  She was brought to Recovery with the chest in the upright position, having tolerated the procedure well.  Sponge and needle counts were correct.             ______________________________  Vonna Kotyk, MD    KD/MODL  DD:  08/14/2020 09:51:49  DT:  08/14/2020 10:27:42  Job #:  [id number]    cc:

## 2020-08-14 NOTE — Discharge Instructions (Signed)
Discharge Instructions - Plastic Surgery  Breast Augmentation    Restrictions:  If you have received sedative medications and/or general anesthesia, you may be drowsy for as long as 24 hours.   Do not drive or operate machinery today.   Do not drink alcoholic beverages today.   Do not make major decisions, sign contracts, etc. today.    A responsible adult should be available to assist you at home.    Diet:   Start with liquids and advance to solid food as tolerated. Goal is over 100 grams of protein per day.   Eat a high fiber diet. To keep bowel movements regular add a probiotic.   Drink PLENTY of liquids (non-alcoholic)    Special Instructions and Activities:   Hydrate - Water   Elevate - Legs with a pillow gently flexing the knees.   Ambulate - Walk every 2 hours.   Respirate - Deep breaths. You must reach your max breath determined pre-operatively.   Sponge bath - keep dressings dry; tepid temperature.   Breast Augmentation:  1. Use incentive spirometer to your max. Use EVERY HOUR while you are awake and at night if you are awake.  2. Slow un-resisted arm movement.  3. Ice to upper chest as tolerated for 24 hours; 20 minutes on, 40 minutes off.  4. Always use a towel between chest and ice.      Medications:  DO NOT TAKE PAIN MEDICATIONS ON AN EMPTY STOMACH   Resume all pre-operative medications except aspirin and anti-inflammatories.   Keflex (antibiotic) as prescribed and Vicodin (pain pill) as prescribed    All patients will take:   Multi vitamin once a day   Vitamin C @ 500 mg a day   Probiotic   Miralax for constipation    Follow-Up Appointment:   Your follow-up appointment will be: Tuesday  French Ana will confirm your appointment the next business day after your surgery.    If You Have A Problem:  First contact your physician.  Dr. Milinda Antis: [phone]    If you have difficulty contacting your physician, call:    [facility]  [phone]  Ask to have your physician called.

## 2020-08-14 NOTE — Anesthesia Postprocedure Evaluation (Signed)
Anesthesia Post-Op Note    Patient: Brianna Gill    Procedure(s) Performed:  Procedure Summary  Date:  08/14/2020 Anesthesia Start: 08/14/2020  8:09 AM Anesthesia Stop: 08/14/2020  9:29 AM Room / Location:  [id number] MAIN OR   Procedure(s):  REMOVAL, TISSUE EXPANDER, BREAST, WITH IMPLANT INSERTION, POSSIBLE CAPSULOTOMY Diagnosis:  Breast implant rupture, subsequent encounter [T85.43XD] Surgeon(s):  [name], MD Responsible Anesthesia Provider:  Oren Section, DO         Recovery Vitals  BP: 103/64 (08/14/2020 11:15 AM)  Heart Rate: 66 (08/14/2020 11:15 AM)  Heart Rate (via Pulse Ox): 72 (08/14/2020 10:15 AM)  Resp: 16 (08/14/2020 11:15 AM)  Temp: 36 C (96.8 F) (08/14/2020 10:22 AM)  SpO2: 97 % (08/14/2020 11:15 AM)  O2 Flow Rate: 2 L/min (08/14/2020 10:15 AM)   0-10 Scale: 3 (08/14/2020 11:15 AM)    Anesthesia type:  general  Complications Noted During Procedure or in PACU:  None   Comment:    Patient Location:  PACU  Level of Consciousness:    Recovered to baseline  Patient Participation:     Able to participate  Temperature Status:    Normothermic  Oxygen Saturation:    Within patient's normal range  Cardiac Status:   Within patient's normal range  Fluid Status:    Stable  Airway Patency:     Yes  Pulmonary Status:    Baseline  Pain Management:    Adequate analgesia  Nausea and Vomiting:  None    Post Op Assessment:    Tolerated procedure well  Responsible Anesthesia Provider Attestation:  All indicated post anesthesia care provided       -

## 2020-08-15 ENCOUNTER — Encounter: Payer: Self-pay | Admitting: Plastic Surgery

## 2020-10-29 ENCOUNTER — Other Ambulatory Visit: Payer: Self-pay | Admitting: Family Medicine

## 2020-10-29 DIAGNOSIS — Z1211 Encounter for screening for malignant neoplasm of colon: Secondary | ICD-10-CM

## 2020-10-29 DIAGNOSIS — Z1231 Encounter for screening mammogram for malignant neoplasm of breast: Secondary | ICD-10-CM

## 2020-12-03 ENCOUNTER — Other Ambulatory Visit: Payer: Self-pay | Admitting: Family Medicine

## 2020-12-03 DIAGNOSIS — Z1231 Encounter for screening mammogram for malignant neoplasm of breast: Secondary | ICD-10-CM

## 2020-12-05 ENCOUNTER — Other Ambulatory Visit: Payer: Self-pay

## 2020-12-05 ENCOUNTER — Ambulatory Visit
Admission: RE | Admit: 2020-12-05 | Discharge: 2020-12-05 | Disposition: A | Discharge status: Home / Self Care | Payer: Federal, State, Local not specified - PPO | Source: Ambulatory Visit | Attending: Family Medicine | Admitting: Family Medicine

## 2020-12-05 DIAGNOSIS — Z1231 Encounter for screening mammogram for malignant neoplasm of breast: Secondary | ICD-10-CM | POA: Diagnosis not present

## 2020-12-19 ENCOUNTER — Ambulatory Visit (INDEPENDENT_AMBULATORY_CARE_PROVIDER_SITE_OTHER): Payer: Federal, State, Local not specified - PPO | Admitting: Family Medicine

## 2020-12-19 ENCOUNTER — Other Ambulatory Visit: Payer: Self-pay

## 2020-12-19 ENCOUNTER — Encounter: Payer: Self-pay | Admitting: Family Medicine

## 2020-12-19 ENCOUNTER — Other Ambulatory Visit (HOSPITAL_COMMUNITY)
Admission: RE | Admit: 2020-12-19 | Discharge: 2020-12-19 | Disposition: A | Discharge status: Home / Self Care | Payer: Federal, State, Local not specified - PPO | Source: Ambulatory Visit | Attending: Family Medicine | Admitting: Family Medicine

## 2020-12-19 VITALS — BP 133/86 | HR 80 | Ht 69.0 in | Wt 218.0 lb

## 2020-12-19 DIAGNOSIS — Z01411 Encounter for gynecological examination (general) (routine) with abnormal findings: Secondary | ICD-10-CM

## 2020-12-19 DIAGNOSIS — Z124 Encounter for screening for malignant neoplasm of cervix: Secondary | ICD-10-CM | POA: Insufficient documentation

## 2020-12-19 DIAGNOSIS — R42 Dizziness and giddiness: Secondary | ICD-10-CM | POA: Diagnosis not present

## 2020-12-19 DIAGNOSIS — R232 Flushing: Secondary | ICD-10-CM | POA: Diagnosis not present

## 2020-12-19 DIAGNOSIS — Z0000 Encounter for general adult medical examination without abnormal findings: Secondary | ICD-10-CM | POA: Diagnosis not present

## 2020-12-19 NOTE — Assessment & Plan Note (Signed)
She is improving. Advised of HRT risks and benefits.  Discussed bone loss, supplemental Ca and Vitamin D, breast cancer.  Also discussed changes related to menopause, normal progression and resolution of symptoms. Discussed risk of DVT, MI, CVA--due to [place] she has declined this. Also could trial clonidine or effexor--she declines this now as well as symptoms are more tolerable. Lifestyle discussed.

## 2020-12-19 NOTE — Assessment & Plan Note (Signed)
Most recently does not feel like her typical vertigo. Wants labs to check for anything abnormal. Has meclizine.

## 2020-12-19 NOTE — Progress Notes (Signed)
Subjective:     Maria Buck is a 55 y.o. female and is here for a comprehensive physical exam. The patient reports no problems. Was having significant hot flashes, these have improved and notes night sweats persist.  The following portions of the patient's history were reviewed and updated as appropriate: allergies, current medications, past family history, past medical history, past social history, past surgical history, and problem list.  Review of Systems Pertinent items noted in HPI and remainder of comprehensive ROS otherwise negative.   Objective:    BP 133/86   Pulse 80   Ht 5\' 9"  (1.753 m)   Wt 218 lb (98.9 kg)   BMI 32.19 kg/m  General appearance: alert, cooperative, and appears stated age Head: Normocephalic, without obvious abnormality, atraumatic Neck: no adenopathy, supple, symmetrical, trachea midline, and thyroid not enlarged, symmetric, no tenderness/mass/nodules Lungs: clear to auscultation bilaterally Breasts: normal appearance, no masses or tenderness Heart: regular rate and rhythm, S1, S2 normal, no murmur, click, rub or gallop Abdomen: soft, non-tender; bowel sounds normal; no masses,  no organomegaly Pelvic: cervix normal in appearance, external genitalia normal, no adnexal masses or tenderness, no cervical motion tenderness, uterus normal size, shape, and consistency, and vagina normal without discharge Extremities: extremities normal, atraumatic, no cyanosis or edema Pulses: 2+ and symmetric Skin: Skin color, texture, turgor normal. No rashes or lesions Lymph nodes: Cervical, supraclavicular, and axillary nodes normal. Neurologic: Grossly normal    Assessment:    Healthy female exam.      Plan:   Problem List Items Addressed This Visit       Unprioritized   Intermittent vertigo    Most recently does not feel like her typical vertigo. Wants labs to check for anything abnormal. Has meclizine.       Hot flashes    She is improving. Advised of  HRT risks and benefits.  Discussed bone loss, supplemental Ca and Vitamin D, breast cancer.  Also discussed changes related to menopause, normal progression and resolution of symptoms. Discussed risk of DVT, MI, CVA--due to [place] she has declined this. Also could trial clonidine or effexor--she declines this now as well as symptoms are more tolerable. Lifestyle discussed.        Other Visit Diagnoses     Encounter for gynecological examination with abnormal finding    -  Primary   Relevant Orders   CBC   Comprehensive metabolic panel   TSH   Lipid panel   Hemoglobin A1c   VITAMIN D 25 Hydroxy (Vit-D Deficiency, Fractures)   Screening for malignant neoplasm of cervix       Relevant Orders   Cytology - PAP( [place])       See After Visit Summary for Counseling Recommendations

## 2020-12-20 LAB — COMPREHENSIVE METABOLIC PANEL
ALT: 15 IU/L (ref 0–32)
AST: 24 IU/L (ref 0–40)
Albumin/Globulin Ratio: 1.2 (ref 1.2–2.2)
Albumin: 4.4 g/dL (ref 3.8–4.9)
Alkaline Phosphatase: 92 IU/L (ref 44–121)
BUN/Creatinine Ratio: 12 (ref 9–23)
BUN: 10 mg/dL (ref 6–24)
Bilirubin Total: 0.4 mg/dL (ref 0.0–1.2)
CO2: 21 mmol/L (ref 20–29)
Calcium: 9.8 mg/dL (ref 8.7–10.2)
Chloride: 100 mmol/L (ref 96–106)
Creatinine, Ser: 0.84 mg/dL (ref 0.57–1.00)
Globulin, Total: 3.6 g/dL (ref 1.5–4.5)
Glucose: 90 mg/dL (ref 65–99)
Potassium: 4.4 mmol/L (ref 3.5–5.2)
Sodium: 139 mmol/L (ref 134–144)
Total Protein: 8 g/dL (ref 6.0–8.5)
eGFR: 83 mL/min/{1.73_m2} (ref >59)

## 2020-12-20 LAB — CBC
Hematocrit: 40.7 % (ref 34.0–46.6)
Hemoglobin: 12.7 g/dL (ref 11.1–15.9)
MCH: 30.8 pg (ref 26.6–33.0)
MCHC: 31.2 g/dL — ABNORMAL LOW (ref 31.5–35.7)
MCV: 99 fL — ABNORMAL HIGH (ref 79–97)
Platelets: 149 10*3/uL — ABNORMAL LOW (ref 150–450)
RBC: 4.13 x10E6/uL (ref 3.77–5.28)
RDW: 12 % (ref 11.7–15.4)
WBC: 6.2 10*3/uL (ref 3.4–10.8)

## 2020-12-20 LAB — HEMOGLOBIN A1C
Est. average glucose Bld gHb Est-mCnc: 97 mg/dL
Hgb A1c MFr Bld: 5 % (ref 4.8–5.6)

## 2020-12-20 LAB — LIPID PANEL
Chol/HDL Ratio: 3.4 ratio (ref 0.0–4.4)
Cholesterol, Total: 209 mg/dL — ABNORMAL HIGH (ref 100–199)
HDL: 61 mg/dL (ref >39)
LDL Chol Calc (NIH): 132 mg/dL — ABNORMAL HIGH (ref 0–99)
Triglycerides: 89 mg/dL (ref 0–149)
VLDL Cholesterol Cal: 16 mg/dL (ref 5–40)

## 2020-12-20 LAB — TSH: TSH: 2.51 u[IU]/mL (ref 0.450–4.500)

## 2020-12-20 LAB — VITAMIN D 25 HYDROXY (VIT D DEFICIENCY, FRACTURES): Vit D, 25-Hydroxy: 66.5 ng/mL (ref 30.0–100.0)

## 2020-12-24 ENCOUNTER — Ambulatory Visit: Payer: Federal, State, Local not specified - PPO | Admitting: Family Medicine

## 2020-12-24 ENCOUNTER — Other Ambulatory Visit: Payer: Self-pay

## 2020-12-24 ENCOUNTER — Encounter: Payer: Self-pay | Admitting: Family Medicine

## 2020-12-24 VITALS — BP 120/80 | HR 85 | Temp 97.6°F | Ht 69.0 in | Wt 222.0 lb

## 2020-12-24 DIAGNOSIS — H9311 Tinnitus, right ear: Secondary | ICD-10-CM | POA: Diagnosis not present

## 2020-12-24 DIAGNOSIS — R42 Dizziness and giddiness: Secondary | ICD-10-CM | POA: Diagnosis not present

## 2020-12-24 DIAGNOSIS — D696 Thrombocytopenia, unspecified: Secondary | ICD-10-CM

## 2020-12-24 MED ORDER — SCOPOLAMINE 1 MG/3DAYS TD PT72: 1.0000 | MEDICATED_PATCH | TRANSDERMAL | 1 refills | Status: DC

## 2020-12-24 NOTE — Progress Notes (Signed)
This visit occurred during the SARS-CoV-2 public health emergency.  Safety protocols were in place, including screening questions prior to the visit, additional usage of staff PPE, and extensive cleaning of exam room while observing appropriate contact time as indicated for disinfecting solutions.  Discussed with patient about recent labs.  Platelet 149.  No bleeding, no nosebleeds, blood in urine or stool.  Recheck labs pending.   Ear ringing, R ear.  Longstanding.  No L sided sx.  Gradually worse.  History of noise exposure with work.    Vertigo d/w pt.  Meclizine didn't help much, she was drowsy with med use.  She has episodic room spinning.  She doesn't have lightheadedness.  She can have variable sx.  Beside exercises didn't help.  She can a period with sx for months at a time.  She squats instead of leaning over to limit looking down.    Meds, vitals, and allergies reviewed.   ROS: Per HPI unless specifically indicated in ROS section   No sx with head rotation in any direction at OV.   GEN: nad, alert and oriented HEENT: ncat, TM WITHOUT ERYTHEMA BILATERALLY  NECK: supple w/o LA CV: rrr.  PULM: ctab, no inc wob ABD: soft, +bs EXT: no edema SKIN: no acute rash CN 2-12 wnl B, S/S wnl x4  33 minutes were devoted to patient care in this encounter (this includes time spent reviewing the patient's file/history, interviewing and examining the patient, counseling/reviewing plan with patient).

## 2020-12-24 NOTE — Patient Instructions (Addendum)
Go to the lab on the way out.   If you have mychart we'll likely use that to update you.    Take care.  Glad to see you. We'll call about seeing ENT.   Use the patches behind an ear if needed for vertigo.

## 2020-12-25 DIAGNOSIS — D696 Thrombocytopenia, unspecified: Secondary | ICD-10-CM | POA: Insufficient documentation

## 2020-12-25 LAB — CBC WITH DIFFERENTIAL/PLATELET
Absolute Monocytes: 360 cells/uL (ref 200–950)
Basophils Absolute: 18 cells/uL (ref 0–200)
Basophils Relative: 0.3 %
Eosinophils Absolute: 150 cells/uL (ref 15–500)
Eosinophils Relative: 2.5 %
HCT: 35.9 % (ref 35.0–45.0)
Hemoglobin: 11.6 g/dL — ABNORMAL LOW (ref 11.7–15.5)
Lymphs Abs: 2142 cells/uL (ref 850–3900)
MCH: 31.9 pg (ref 27.0–33.0)
MCHC: 32.3 g/dL (ref 32.0–36.0)
MCV: 98.6 fL (ref 80.0–100.0)
MPV: 11.8 fL (ref 7.5–12.5)
Monocytes Relative: 6 %
Neutro Abs: 3330 cells/uL (ref 1500–7800)
Neutrophils Relative %: 55.5 %
Platelets: 167 10*3/uL (ref 140–400)
RBC: 3.64 10*6/uL — ABNORMAL LOW (ref 3.80–5.10)
RDW: 12 % (ref 11.0–15.0)
Total Lymphocyte: 35.7 %
WBC: 6 10*3/uL (ref 3.8–10.8)

## 2020-12-25 LAB — CYTOLOGY - PAP
Adequacy: ABSENT
Comment: NEGATIVE
Diagnosis: UNDETERMINED — AB
High risk HPV: NEGATIVE

## 2020-12-25 LAB — PATHOLOGIST SMEAR REVIEW

## 2020-12-25 NOTE — Assessment & Plan Note (Signed)
She has ear ringing but also has prolonged episodes of vertigo and I think it makes sense to see ENT.  We talked about possible Mnire's disease or other causes of vertigo other than BPV.  I need ENT input.  I will defer imaging to ENT.  Meclizine did not help.  It is likely reasonable to try scopolamine patch to see if that helps.  Routine cautions given to patient about that medication.  Vertigo cautions given to patient.  Work note done.

## 2020-12-25 NOTE — Assessment & Plan Note (Signed)
Minimal, no bleeding.  Platelet level currently not significantly low.  Discussed.  Resolved check peripheral smear and recheck CBC.  See notes on labs.

## 2020-12-29 ENCOUNTER — Other Ambulatory Visit: Payer: Self-pay | Admitting: Family Medicine

## 2020-12-29 DIAGNOSIS — D649 Anemia, unspecified: Secondary | ICD-10-CM

## 2020-12-30 ENCOUNTER — Telehealth: Payer: Self-pay | Admitting: Family Medicine

## 2020-12-30 ENCOUNTER — Other Ambulatory Visit: Payer: Self-pay

## 2020-12-30 MED ORDER — PEG 3350-KCL-NA BICARB-NACL 420 G PO SOLR: ORAL | 0 refills | Status: DC

## 2020-12-30 NOTE — Telephone Encounter (Signed)
Per results note; Dr. Damita Dunnings wanted patient to have labs done if colonoscopy could not be done within 2 weeks. Patient scheduled for labs on 01/06/21 at 8:00 am.

## 2020-12-30 NOTE — Progress Notes (Signed)
Patient has colonoscopy on 01/21/21; labs scheduled for 01/06/21 @ 8 am.

## 2020-12-30 NOTE — Telephone Encounter (Signed)
New message    Patient was told call back with colonoscopy appt  8.16.2022.   Was told if appt not sooner then MD would order blood work .

## 2021-01-02 ENCOUNTER — Encounter: Payer: Self-pay | Admitting: Family Medicine

## 2021-01-06 ENCOUNTER — Other Ambulatory Visit: Payer: Self-pay

## 2021-01-06 ENCOUNTER — Other Ambulatory Visit (INDEPENDENT_AMBULATORY_CARE_PROVIDER_SITE_OTHER): Payer: Federal, State, Local not specified - PPO

## 2021-01-06 DIAGNOSIS — D649 Anemia, unspecified: Secondary | ICD-10-CM

## 2021-01-06 LAB — CBC WITH DIFFERENTIAL/PLATELET
Basophils Absolute: 0 10*3/uL (ref 0.0–0.1)
Basophils Relative: 0.6 % (ref 0.0–3.0)
Eosinophils Absolute: 0.2 10*3/uL (ref 0.0–0.7)
Eosinophils Relative: 3.1 % (ref 0.0–5.0)
HCT: 35.5 % — ABNORMAL LOW (ref 36.0–46.0)
Hemoglobin: 11.6 g/dL — ABNORMAL LOW (ref 12.0–15.0)
Lymphocytes Relative: 40.5 % (ref 12.0–46.0)
Lymphs Abs: 2.4 10*3/uL (ref 0.7–4.0)
MCHC: 32.9 g/dL (ref 30.0–36.0)
MCV: 97.5 fl (ref 78.0–100.0)
Monocytes Absolute: 0.4 10*3/uL (ref 0.1–1.0)
Monocytes Relative: 6.6 % (ref 3.0–12.0)
Neutro Abs: 3 10*3/uL (ref 1.4–7.7)
Neutrophils Relative %: 49.2 % (ref 43.0–77.0)
Platelets: 235 10*3/uL (ref 150.0–400.0)
RBC: 3.63 Mil/uL — ABNORMAL LOW (ref 3.87–5.11)
RDW: 13.3 % (ref 11.5–15.5)
WBC: 6 10*3/uL (ref 4.0–10.5)

## 2021-01-06 LAB — VITAMIN B12: Vitamin B-12: >1550 pg/mL — ABNORMAL HIGH (ref 211–911)

## 2021-01-06 LAB — IRON: Iron: 77 ug/dL (ref 42–145)

## 2021-01-08 ENCOUNTER — Encounter: Payer: Self-pay | Admitting: Family Medicine

## 2021-01-10 ENCOUNTER — Other Ambulatory Visit: Payer: Self-pay | Admitting: Family Medicine

## 2021-01-10 DIAGNOSIS — D649 Anemia, unspecified: Secondary | ICD-10-CM

## 2021-01-10 NOTE — Addendum Note (Signed)
Addended by: Tonia Ghent on: 01/10/2021 12:36 PM   Modules accepted: Orders

## 2021-01-30 DIAGNOSIS — H9311 Tinnitus, right ear: Secondary | ICD-10-CM | POA: Diagnosis not present

## 2021-01-30 DIAGNOSIS — H6123 Impacted cerumen, bilateral: Secondary | ICD-10-CM | POA: Diagnosis not present

## 2021-01-30 DIAGNOSIS — R42 Dizziness and giddiness: Secondary | ICD-10-CM | POA: Diagnosis not present

## 2021-01-30 DIAGNOSIS — H9041 Sensorineural hearing loss, unilateral, right ear, with unrestricted hearing on the contralateral side: Secondary | ICD-10-CM | POA: Diagnosis not present

## 2021-01-31 ENCOUNTER — Other Ambulatory Visit: Payer: Self-pay | Admitting: Otolaryngology

## 2021-01-31 DIAGNOSIS — H9311 Tinnitus, right ear: Secondary | ICD-10-CM

## 2021-01-31 DIAGNOSIS — R42 Dizziness and giddiness: Secondary | ICD-10-CM

## 2021-02-03 ENCOUNTER — Ambulatory Visit
Admission: RE | Admit: 2021-02-03 | Discharge: 2021-02-03 | Disposition: A | Discharge status: Home / Self Care | Payer: Federal, State, Local not specified - PPO | Attending: Gastroenterology | Admitting: Gastroenterology

## 2021-02-03 ENCOUNTER — Other Ambulatory Visit: Payer: Self-pay | Admitting: Gastroenterology

## 2021-02-03 ENCOUNTER — Encounter
Admission: RE | Disposition: A | Discharge status: Home / Self Care | Payer: Self-pay | Source: Home / Self Care | Attending: Gastroenterology

## 2021-02-03 ENCOUNTER — Ambulatory Visit: Payer: Federal, State, Local not specified - PPO | Admitting: Anesthesiology

## 2021-02-03 ENCOUNTER — Encounter: Payer: Self-pay | Admitting: Gastroenterology

## 2021-02-03 ENCOUNTER — Other Ambulatory Visit: Payer: Self-pay

## 2021-02-03 DIAGNOSIS — Z1211 Encounter for screening for malignant neoplasm of colon: Secondary | ICD-10-CM

## 2021-02-03 DIAGNOSIS — Z79899 Other long term (current) drug therapy: Secondary | ICD-10-CM | POA: Insufficient documentation

## 2021-02-03 DIAGNOSIS — Z86010 Personal history of colonic polyps: Secondary | ICD-10-CM | POA: Diagnosis not present

## 2021-02-03 DIAGNOSIS — D649 Anemia, unspecified: Secondary | ICD-10-CM

## 2021-02-03 HISTORY — PX: COLONOSCOPY: SHX5424

## 2021-02-03 LAB — CBC
HCT: 33.5 % — ABNORMAL LOW (ref 36.0–46.0)
Hemoglobin: 10.8 g/dL — ABNORMAL LOW (ref 12.0–15.0)
MCH: 32 pg (ref 26.0–34.0)
MCHC: 32.2 g/dL (ref 30.0–36.0)
MCV: 99.4 fL (ref 80.0–100.0)
Platelets: 296 10*3/uL (ref 150–400)
RBC: 3.37 MIL/uL — ABNORMAL LOW (ref 3.87–5.11)
RDW: 12 % (ref 11.5–15.5)
WBC: 8.4 10*3/uL (ref 4.0–10.5)
nRBC: 0 % (ref 0.0–0.2)

## 2021-02-03 LAB — FOLATE: Folate: 19.4 ng/mL (ref >5.9)

## 2021-02-03 LAB — IRON AND TIBC
Iron: 32 ug/dL (ref 28–170)
Saturation Ratios: 12 % (ref 10.4–31.8)
TIBC: 277 ug/dL (ref 250–450)
UIBC: 245 ug/dL

## 2021-02-03 LAB — FERRITIN: Ferritin: 110 ng/mL (ref 11–307)

## 2021-02-03 SURGERY — COLONOSCOPY
Anesthesia: General

## 2021-02-03 MED ORDER — PROPOFOL 500 MG/50ML IV EMUL
INTRAVENOUS | Status: AC
  Filled 2021-02-03: qty 50

## 2021-02-03 MED ORDER — PROPOFOL 500 MG/50ML IV EMUL
INTRAVENOUS | Status: DC | PRN
  Administered 2021-02-03: 120 ug/kg/min via INTRAVENOUS

## 2021-02-03 MED ORDER — SODIUM CHLORIDE 0.9 % IV SOLN: INTRAVENOUS | Status: DC

## 2021-02-03 NOTE — Anesthesia Procedure Notes (Signed)
Date/Time: 02/03/2021 8:49 AM Performed by: Vaughan Sine Pre-anesthesia Checklist: Patient identified, Emergency Drugs available, Suction available, Patient being monitored and Timeout performed Patient Re-evaluated:Patient Re-evaluated prior to induction Oxygen Delivery Method: Simple face mask Preoxygenation: Pre-oxygenation with 100% oxygen Induction Type: IV induction Placement Confirmation: positive ETCO2 and CO2 detector

## 2021-02-03 NOTE — Op Note (Signed)
[facility] Gastroenterology Patient Name: Maria Buck Procedure Date: 02/03/2021 8:44 AM MRN: [id number] Account #: [id number] Date of Birth: 1965/08/21 Admit Type: Outpatient Age: 55 Room: [facility] ENDO ROOM 1 Gender: Female Note Status: Finalized Procedure:             Colonoscopy Indications:           Screening for colorectal malignant neoplasm Providers:             Lin Landsman MD, MD Referring MD:          Elby Beck (Referring MD) Medicines:             General Anesthesia Complications:         No immediate complications. Estimated blood loss: None. Procedure:             Pre-Anesthesia Assessment:                        - Prior to the procedure, a History and Physical was                         performed, and patient medications and allergies were                         reviewed. The patient is competent. The risks and                         benefits of the procedure and the sedation options and                         risks were discussed with the patient. All questions                         were answered and informed consent was obtained.                         Patient identification and proposed procedure were                         verified by the physician, the nurse, the                         anesthesiologist, the anesthetist and the technician                         in the pre-procedure area in the procedure room in the                         endoscopy suite. Mental Status Examination: alert and                         oriented. Airway Examination: normal oropharyngeal                         airway and neck mobility. Respiratory Examination:                         clear to auscultation. CV Examination: normal.  Prophylactic Antibiotics: The patient does not require                         prophylactic antibiotics. Prior Anticoagulants: The                         patient has taken no previous  anticoagulant or                         antiplatelet agents. ASA Grade Assessment: II - A                         patient with mild systemic disease. After reviewing                         the risks and benefits, the patient was deemed in                         satisfactory condition to undergo the procedure. The                         anesthesia plan was to use general anesthesia.                         Immediately prior to administration of medications,                         the patient was re-assessed for adequacy to receive                         sedatives. The heart rate, respiratory rate, oxygen                         saturations, blood pressure, adequacy of pulmonary                         ventilation, and response to care were monitored                         throughout the procedure. The physical status of the                         patient was re-assessed after the procedure.                        After obtaining informed consent, the colonoscope was                         passed under direct vision. Throughout the procedure,                         the patient's blood pressure, pulse, and oxygen                         saturations were monitored continuously. The                         Colonoscope was introduced through the anus and  advanced to the the cecum, identified by appendiceal                         orifice and ileocecal valve. The colonoscopy was                         performed without difficulty. The patient tolerated                         the procedure well. The quality of the bowel                         preparation was evaluated using the BBPS [facility] Bowel                         Preparation Scale) with scores of: Right Colon = 3,                         Transverse Colon = 3 and Left Colon = 3 (entire mucosa                         seen well with no residual staining, small fragments                         of stool or  opaque liquid). The total BBPS score                         equals 9. Findings:      The perianal and digital rectal examinations were normal. Pertinent       negatives include normal sphincter tone and no palpable rectal lesions.      The entire examined colon appeared normal.      The retroflexed view of the distal rectum and anal verge was normal and       showed no anal or rectal abnormalities. Impression:            - The entire examined colon is normal.                        - The distal rectum and anal verge are normal on                         retroflexion view.                        - No specimens collected. Recommendation:        - Discharge patient to home (with escort).                        - Resume previous diet today.                        - Continue present medications.                        - Repeat colonoscopy in 10 years for screening                         purposes. Procedure Code(s):     ---  Professional ---                        [id number], Colorectal cancer screening; colonoscopy on                         individual not meeting criteria for high risk Diagnosis Code(s):     --- Professional ---                        Z12.11, Encounter for screening for malignant neoplasm                         of colon CPT copyright 2019 American Medical Association. All rights reserved. The codes documented in this report are preliminary and upon coder review may  be revised to meet current compliance requirements. Dr. Ulyess Mort Lin Landsman MD, MD 02/03/2021 9:02:38 AM This report has been signed electronically. Number of Addenda: 0 Note Initiated On: 02/03/2021 8:44 AM Scope Withdrawal Time: 0 hours 7 minutes 35 seconds  Total Procedure Duration: 0 hours 11 minutes 16 seconds  Estimated Blood Loss:  Estimated blood loss: none.      [facility]

## 2021-02-03 NOTE — Anesthesia Postprocedure Evaluation (Signed)
Anesthesia Post Note  Patient: Maria Buck  Procedure(s) Performed: COLONOSCOPY  Patient location during evaluation: PACU Anesthesia Type: General Level of consciousness: awake and alert Pain management: pain level controlled Vital Signs Assessment: post-procedure vital signs reviewed and stable Respiratory status: spontaneous breathing, nonlabored ventilation, respiratory function stable and patient connected to nasal cannula oxygen Cardiovascular status: blood pressure returned to baseline and stable Postop Assessment: no apparent nausea or vomiting Anesthetic complications: no   No notable events documented.   Last Vitals:  Vitals:   02/03/21 0905 02/03/21 0915  BP: 97/69 103/71  Pulse: 79 72  Resp: 19 15  Temp:  (!) 36.1 C  SpO2: 96% 100%    Last Pain:  Vitals:   02/03/21 0915  TempSrc: Temporal  PainSc: 0-No pain                 Molli Barrows

## 2021-02-03 NOTE — Transfer of Care (Signed)
Immediate Anesthesia Transfer of Care Note  Patient: Maria Buck  Procedure(s) Performed: COLONOSCOPY  Patient Location: PACU  Anesthesia Type:General  Level of Consciousness: awake and sedated  Airway & Oxygen Therapy: Patient Spontanous Breathing and Patient connected to nasal cannula oxygen  Post-op Assessment: Report given to RN and Post -op Vital signs reviewed and stable  Post vital signs: Reviewed and stable  Last Vitals:  Vitals Value Taken Time  BP    Temp    Pulse    Resp    SpO2      Last Pain:  Vitals:   02/03/21 0813  TempSrc: Temporal  PainSc: 0-No pain         Complications: No notable events documented.

## 2021-02-03 NOTE — Anesthesia Preprocedure Evaluation (Signed)
Anesthesia Evaluation  Patient identified by MRN, date of birth, ID band Patient awake    Reviewed: Allergy & Precautions, NPO status , Patient's Chart, lab work & pertinent test results  History of Anesthesia Complications Negative for: history of anesthetic complications  Airway Mallampati: II       Dental  (+) Chipped   Pulmonary neg sleep apnea, neg COPD, Not current smoker,    Pulmonary exam normal        Cardiovascular (-) hypertension(-) Past MI and (-) CHF Normal cardiovascular exam(-) dysrhythmias (-) Valvular Problems/Murmurs     Neuro/Psych neg Seizures Anxiety    GI/Hepatic Neg liver ROS, neg GERD  ,  Endo/Other  neg diabetes  Renal/GU negative Renal ROS     Musculoskeletal   Abdominal   Peds  Hematology  (+) Blood dyscrasia, anemia ,   Anesthesia Other Findings Past Medical History: No date: Medical history non-contributory   Reproductive/Obstetrics                             Anesthesia Physical  Anesthesia Plan  ASA: II  Anesthesia Plan: General   Post-op Pain Management:    Induction: Intravenous  PONV Risk Score and Plan: 3 and Propofol infusion, TIVA and Treatment may vary due to age or medical condition  Airway Management Planned: Nasal Cannula  Additional Equipment:   Intra-op Plan:   Post-operative Plan:   Informed Consent: I have reviewed the patients History and Physical, chart, labs and discussed the procedure including the risks, benefits and alternatives for the proposed anesthesia with the patient or authorized representative who has indicated his/her understanding and acceptance.     Dental Advisory Given  Plan Discussed with: Anesthesiologist, CRNA and Surgeon  Anesthesia Plan Comments: (Patient consented for risks of anesthesia including but not limited to:  - adverse reactions to medications - risk of airway placement if required -  damage to eyes, teeth, lips or other oral mucosa - nerve damage due to positioning  - sore throat or hoarseness - Damage to heart, brain, nerves, lungs, other parts of body or loss of life  Patient voiced understanding.)        Anesthesia Quick Evaluation

## 2021-02-03 NOTE — H&P (Signed)
Cephas Darby, MD [address]  [place]  [place], [place] [address]  Main: [phone]  Fax: [phone] Pager: [phone]  Primary Care Physician:  Elby Beck, FNP Primary Gastroenterologist:  Dr. Cephas Darby  Pre-Procedure History & Physical: HPI:  Maria Buck is a 55 y.o. female is here for an colonoscopy.   Past Medical History:  Diagnosis Date   Medical history non-contributory     Past Surgical History:  Procedure Laterality Date   COLONOSCOPY WITH PROPOFOL N/A 10/16/2019   Procedure: COLONOSCOPY WITH PROPOFOL;  Surgeon: Lin Landsman, MD;  Location: [facility] ENDOSCOPY;  Service: Gastroenterology;  Laterality: N/A;   NO PAST SURGERIES      Prior to Admission medications   Medication Sig Start Date End Date Taking? Authorizing Provider  Cholecalciferol (VITAMIN D3 GUMMIES PO) Take by mouth.    [provider]  Cyanocobalamin (VITAMIN B-12 PO) Take by mouth.    [provider]  Multiple Vitamin (MULTIVITAMIN ADULT PO) Take by mouth.    [provider]  polyethylene glycol-electrolytes (GAVILYTE-N WITH FLAVOR PACK) 420 g solution Drink one 8 oz glass every 20 mins until entire container is finished starting at 5:00pm on 01/20/21 12/30/20   Virgel Manifold, MD  scopolamine (TRANSDERM-SCOP, 1.5 MG,) 1 MG/3DAYS Place 1 patch (1.5 mg total) onto the skin every 3 (three) days. 12/24/20   Tonia Ghent, MD  TURMERIC PO Take by mouth.    [provider]    Allergies as of 12/30/2020   (No Known Allergies)    Family History  Problem Relation Age of Onset   Thyroid cancer Mother    Diabetes Father    Heart attack Father    Deep vein thrombosis Father    Diabetes Sister    Thyroid cancer Sister    Cervical cancer Maternal Aunt    Heart attack Maternal Aunt    Thyroid cancer Maternal Aunt    Ovarian cancer Maternal Aunt    Heart attack Maternal Uncle    Diabetes Paternal Aunt    Deep vein thrombosis  Paternal Uncle    Breast cancer Maternal Grandmother 68   Heart attack Maternal Grandmother    Diabetes Paternal Grandmother    Deep vein thrombosis Cousin    Colon cancer Neg Hx     Social History   Socioeconomic History   Marital status: Single    Spouse name: Not on file   Number of children: Not on file   Years of education: Not on file   Highest education level: Not on file  Occupational History   Not on file  Tobacco Use   Smoking status: Never   Smokeless tobacco: Never  Vaping Use   Vaping Use: Never used  Substance and Sexual Activity   Alcohol use: Yes    Comment: rare    Drug use: Never   Sexual activity: Yes  Other Topics Concern   Not on file  Social History Narrative   Not on file   Social Determinants of Health   Financial Resource Strain: Not on file  Food Insecurity: Not on file  Transportation Needs: Not on file  Physical Activity: Not on file  Stress: Not on file  Social Connections: Not on file  Intimate Partner Violence: Not on file    Review of Systems: See HPI, otherwise negative ROS  Physical Exam: BP (!) 143/85   Pulse 74   Temp (!) 97.1 F (36.2 C) (Temporal)   Resp 18  Ht '5\' 9"'$  (1.753 m)   Wt 98.4 kg   SpO2 100%   BMI 32.05 kg/m  General:   Alert,  pleasant and cooperative in NAD Head:  Normocephalic and atraumatic. Neck:  Supple; no masses or thyromegaly. Lungs:  Clear throughout to auscultation.    Heart:  Regular rate and rhythm. Abdomen:  Soft, nontender and nondistended. Normal bowel sounds, without guarding, and without rebound.   Neurologic:  Alert and  oriented x4;  grossly normal neurologically.  Impression/Plan: Maria Buck is here for an colonoscopy to be performed for colon cancer screening, poor prep during prior colonoscopy  Risks, benefits, limitations, and alternatives regarding  colonoscopy have been reviewed with the patient.  Questions have been answered.  All parties agreeable.   Sherri Sear,  MD  02/03/2021, 8:18 AM

## 2021-02-04 ENCOUNTER — Telehealth: Payer: Self-pay

## 2021-02-04 ENCOUNTER — Telehealth: Payer: Self-pay | Admitting: Gastroenterology

## 2021-02-04 ENCOUNTER — Encounter: Payer: Self-pay | Admitting: Gastroenterology

## 2021-02-04 DIAGNOSIS — D649 Anemia, unspecified: Secondary | ICD-10-CM

## 2021-02-04 NOTE — Telephone Encounter (Signed)
Pt. Calling back about lab results. Asking for a return call.

## 2021-02-04 NOTE — Telephone Encounter (Signed)
Order the lab test. Called and left a message for patient. Sent mychart message

## 2021-02-04 NOTE — Telephone Encounter (Signed)
Went over results with patient and patient verbalized understanding of results. States she will come today for the lab

## 2021-02-04 NOTE — Telephone Encounter (Signed)
-----   Message from Lin Landsman, MD sent at 02/03/2021  5:19 PM EDT ----- Patient remains slightly anemic.  Recommend H. pylori breath test  RV

## 2021-02-05 LAB — H. PYLORI BREATH TEST: H pylori Breath Test: NEGATIVE

## 2021-02-07 NOTE — Telephone Encounter (Signed)
I recommend hematology/oncology referral for further evaluation of anemia If patient is agreeable, please go ahead and set up upper endoscopy for unexplained anemia  RV

## 2021-02-11 ENCOUNTER — Other Ambulatory Visit: Payer: Self-pay

## 2021-02-11 ENCOUNTER — Telehealth: Payer: Self-pay | Admitting: Gastroenterology

## 2021-02-11 DIAGNOSIS — D649 Anemia, unspecified: Secondary | ICD-10-CM

## 2021-02-11 NOTE — Telephone Encounter (Signed)
Place referral to hematology and scheduled the EGD for 02/24/2021. Went over instructions with patient and sent to [place] and mailed

## 2021-02-11 NOTE — Addendum Note (Signed)
Addended by: Ulyess Blossom L on: 02/11/2021 08:22 AM   Modules accepted: Orders

## 2021-02-11 NOTE — Telephone Encounter (Signed)
Needs to r/s her EGD.

## 2021-02-11 NOTE — Telephone Encounter (Signed)
Patient wants to change procedure to 02/26/2021 with Dr. Marius Ditch. Called ENDO and change patient to 02/26/2021 talk to [place]. Updated referral and sent new instructions to patient

## 2021-02-12 ENCOUNTER — Ambulatory Visit
Admission: RE | Admit: 2021-02-12 | Discharge: 2021-02-12 | Disposition: A | Discharge status: Home / Self Care | Payer: Federal, State, Local not specified - PPO | Source: Ambulatory Visit | Attending: Otolaryngology | Admitting: Otolaryngology

## 2021-02-12 ENCOUNTER — Other Ambulatory Visit: Payer: Self-pay

## 2021-02-12 DIAGNOSIS — H9311 Tinnitus, right ear: Secondary | ICD-10-CM | POA: Insufficient documentation

## 2021-02-12 DIAGNOSIS — R42 Dizziness and giddiness: Secondary | ICD-10-CM | POA: Insufficient documentation

## 2021-02-12 DIAGNOSIS — H9319 Tinnitus, unspecified ear: Secondary | ICD-10-CM | POA: Diagnosis not present

## 2021-02-12 MED ORDER — GADOBUTROL 1 MMOL/ML IV SOLN
10.0000 mL | Freq: Once | INTRAVENOUS | Status: AC | PRN
  Administered 2021-02-12: 10 mL via INTRAVENOUS

## 2021-02-19 ENCOUNTER — Inpatient Hospital Stay: Payer: Federal, State, Local not specified - PPO | Attending: Oncology | Admitting: Oncology

## 2021-02-19 ENCOUNTER — Encounter: Payer: Self-pay | Admitting: Oncology

## 2021-02-19 ENCOUNTER — Inpatient Hospital Stay: Payer: Federal, State, Local not specified - PPO

## 2021-02-19 VITALS — BP 128/86 | HR 82 | Temp 98.1°F | Resp 18 | Ht 69.0 in | Wt 225.6 lb

## 2021-02-19 DIAGNOSIS — R5383 Other fatigue: Secondary | ICD-10-CM | POA: Insufficient documentation

## 2021-02-19 DIAGNOSIS — D649 Anemia, unspecified: Secondary | ICD-10-CM

## 2021-02-19 DIAGNOSIS — Z8042 Family history of malignant neoplasm of prostate: Secondary | ICD-10-CM | POA: Diagnosis not present

## 2021-02-19 DIAGNOSIS — Z8041 Family history of malignant neoplasm of ovary: Secondary | ICD-10-CM | POA: Insufficient documentation

## 2021-02-19 DIAGNOSIS — Z803 Family history of malignant neoplasm of breast: Secondary | ICD-10-CM | POA: Diagnosis not present

## 2021-02-19 DIAGNOSIS — Z8049 Family history of malignant neoplasm of other genital organs: Secondary | ICD-10-CM | POA: Insufficient documentation

## 2021-02-19 DIAGNOSIS — Z808 Family history of malignant neoplasm of other organs or systems: Secondary | ICD-10-CM | POA: Insufficient documentation

## 2021-02-19 DIAGNOSIS — Z809 Family history of malignant neoplasm, unspecified: Secondary | ICD-10-CM

## 2021-02-19 LAB — RETIC PANEL
Immature Retic Fract: 11.6 % (ref 2.3–15.9)
RBC.: 3.86 MIL/uL — ABNORMAL LOW (ref 3.87–5.11)
Retic Count, Absolute: 93.8 10*3/uL (ref 19.0–186.0)
Retic Ct Pct: 2.4 % (ref 0.4–3.1)
Reticulocyte Hemoglobin: 33.4 pg (ref >27.9)

## 2021-02-19 LAB — CBC WITH DIFFERENTIAL/PLATELET
Abs Immature Granulocytes: 0.02 10*3/uL (ref 0.00–0.07)
Basophils Absolute: 0 10*3/uL (ref 0.0–0.1)
Basophils Relative: 0 %
Eosinophils Absolute: 0.2 10*3/uL (ref 0.0–0.5)
Eosinophils Relative: 2 %
HCT: 38.2 % (ref 36.0–46.0)
Hemoglobin: 12.4 g/dL (ref 12.0–15.0)
Immature Granulocytes: 0 %
Lymphocytes Relative: 32 %
Lymphs Abs: 2.3 10*3/uL (ref 0.7–4.0)
MCH: 31.7 pg (ref 26.0–34.0)
MCHC: 32.5 g/dL (ref 30.0–36.0)
MCV: 97.7 fL (ref 80.0–100.0)
Monocytes Absolute: 0.5 10*3/uL (ref 0.1–1.0)
Monocytes Relative: 7 %
Neutro Abs: 4.1 10*3/uL (ref 1.7–7.7)
Neutrophils Relative %: 59 %
Platelets: 335 10*3/uL (ref 150–400)
RBC: 3.91 MIL/uL (ref 3.87–5.11)
RDW: 11.7 % (ref 11.5–15.5)
WBC: 7.1 10*3/uL (ref 4.0–10.5)
nRBC: 0 % (ref 0.0–0.2)

## 2021-02-19 LAB — FERRITIN: Ferritin: 94 ng/mL (ref 11–307)

## 2021-02-19 LAB — IRON AND TIBC
Iron: 66 ug/dL (ref 28–170)
Saturation Ratios: 21 % (ref 10.4–31.8)
TIBC: 312 ug/dL (ref 250–450)
UIBC: 246 ug/dL

## 2021-02-19 LAB — TSH: TSH: 1.727 u[IU]/mL (ref 0.350–4.500)

## 2021-02-19 LAB — LACTATE DEHYDROGENASE: LDH: 154 U/L (ref 98–192)

## 2021-02-19 NOTE — Progress Notes (Signed)
Hematology/Oncology Consult note [facility] [phone] [phone]   Patient Care Team: Elby Beck, FNP as PCP - General (Nurse Practitioner)  REFERRING PROVIDER: Lin Landsman, MD  CHIEF COMPLAINTS/REASON FOR VISIT:  Evaluation of anemia  HISTORY OF PRESENTING ILLNESS:  Maria Buck is a  55 y.o.  female with PMH listed below who was referred to me for evaluation of anemia Reviewed patient's recent labs that was done.  02/03/2021 Labs revealed anemia with hemoglobin of 10.8, MCV 99   Reviewed patient's previous labs ordered by primary care physician's office, anemia is chronic onset , duration is since July 2022 No aggravating or improving factors.  Associated signs and symptoms: Patient reports fatigue.  Denies SOB with exertion.  Denies weight loss, easy bruising, hematochezia, hemoptysis, hematuria. 02/03/2021 colonoscopy - normal examination.  02/03/2021 Iron panel showed ferritin 110, iron saturation 12,  01/06/2021 B12 1550 folate 19.4   Review of Systems  Constitutional:  Negative for appetite change, chills, fatigue and fever.  HENT:   Negative for hearing loss and voice change.   Eyes:  Negative for eye problems.  Respiratory:  Negative for chest tightness and cough.   Cardiovascular:  Negative for chest pain.  Gastrointestinal:  Negative for abdominal distention, abdominal pain and blood in stool.  Endocrine: Negative for hot flashes.  Genitourinary:  Negative for difficulty urinating and frequency.   Musculoskeletal:  Negative for arthralgias.  Skin:  Negative for itching and rash.  Neurological:  Negative for extremity weakness.  Hematological:  Negative for adenopathy.  Psychiatric/Behavioral:  Negative for confusion.     MEDICAL HISTORY:  Past Medical History:  Diagnosis Date   Medical history non-contributory     SURGICAL HISTORY: Past Surgical History:  Procedure Laterality Date   COLONOSCOPY  N/A 02/03/2021   Procedure: COLONOSCOPY;  Surgeon: Lin Landsman, MD;  Location: [facility] ENDOSCOPY;  Service: Gastroenterology;  Laterality: N/A;   COLONOSCOPY WITH PROPOFOL N/A 10/16/2019   Procedure: COLONOSCOPY WITH PROPOFOL;  Surgeon: Lin Landsman, MD;  Location: [facility] ENDOSCOPY;  Service: Gastroenterology;  Laterality: N/A;   NO PAST SURGERIES      SOCIAL HISTORY: Social History   Socioeconomic History   Marital status: Single    Spouse name: Not on file   Number of children: Not on file   Years of education: Not on file   Highest education level: Not on file  Occupational History   Not on file  Tobacco Use   Smoking status: Never   Smokeless tobacco: Never  Vaping Use   Vaping Use: Never used  Substance and Sexual Activity   Alcohol use: Yes    Comment: rare    Drug use: Never   Sexual activity: Yes  Other Topics Concern   Not on file  Social History Narrative   Not on file   Social Determinants of Health   Financial Resource Strain: Not on file  Food Insecurity: Not on file  Transportation Needs: Not on file  Physical Activity: Not on file  Stress: Not on file  Social Connections: Not on file  Intimate Partner Violence: Not on file    FAMILY HISTORY: Family History  Problem Relation Age of Onset   Diabetes Father    Heart attack Father    Deep vein thrombosis Father    Kidney failure Father    Diabetes Sister    Thyroid cancer Sister    Cervical cancer Maternal Aunt    Heart attack Maternal Aunt  Ovarian cancer Maternal Aunt    Heart Problems Maternal Uncle    Prostate cancer Maternal Uncle    Diabetes Paternal Aunt    Deep vein thrombosis Paternal Uncle    Breast cancer Maternal Grandmother 74   Heart attack Maternal Grandmother    Diabetes Paternal Grandmother    Prostate cancer Paternal Grandfather    Deep vein thrombosis Cousin    Colon cancer Neg Hx     ALLERGIES:  has No Known Allergies.  MEDICATIONS:  Current Outpatient  Medications  Medication Sig Dispense Refill   Cholecalciferol (VITAMIN D3 GUMMIES PO) Take by mouth.     Cyanocobalamin (VITAMIN B-12 PO) Take by mouth.     ferrous sulfate 325 (65 FE) MG tablet Take 325 mg by mouth daily with breakfast.     Multiple Vitamin (MULTIVITAMIN ADULT PO) Take by mouth.     TURMERIC PO Take by mouth.     No current facility-administered medications for this visit.     PHYSICAL EXAMINATION: ECOG PERFORMANCE STATUS: 0 - Asymptomatic Vitals:   02/19/21 1507  BP: 128/86  Pulse: 82  Resp: 18  Temp: 98.1 F (36.7 C)   Filed Weights   02/19/21 1507  Weight: 225 lb 9.6 oz (102.3 kg)    Physical Exam Constitutional:      General: She is not in acute distress. HENT:     Head: Normocephalic and atraumatic.  Eyes:     General: No scleral icterus. Cardiovascular:     Rate and Rhythm: Normal rate and regular rhythm.     Heart sounds: Normal heart sounds.  Pulmonary:     Effort: Pulmonary effort is normal. No respiratory distress.     Breath sounds: No wheezing.  Abdominal:     General: Bowel sounds are normal. There is no distension.     Palpations: Abdomen is soft.  Musculoskeletal:        General: No deformity. Normal range of motion.     Cervical back: Normal range of motion and neck supple.  Skin:    General: Skin is warm and dry.     Findings: No erythema or rash.  Neurological:     Mental Status: She is alert and oriented to person, place, and time. Mental status is at baseline.     Cranial Nerves: No cranial nerve deficit.     Coordination: Coordination normal.  Psychiatric:        Mood and Affect: Mood normal.     LABORATORY DATA:  I have reviewed the data as listed Lab Results  Component Value Date   WBC 7.1 02/19/2021   HGB 12.4 02/19/2021   HCT 38.2 02/19/2021   MCV 97.7 02/19/2021   PLT 335 02/19/2021   Recent Labs    12/19/20 1000  NA 139  K 4.4  CL 100  CO2 21  GLUCOSE 90  BUN 10  CREATININE 0.84  CALCIUM 9.8   PROT 8.0  ALBUMIN 4.4  AST 24  ALT 15  ALKPHOS 92  BILITOT 0.4   Iron/TIBC/Ferritin/ %Sat    Component Value Date/Time   IRON 66 02/19/2021 1556   TIBC 312 02/19/2021 1556   FERRITIN 94 02/19/2021 1556   IRONPCTSAT 21 02/19/2021 1556        ASSESSMENT & PLAN:  1. Normocytic anemia   2. Family history of cancer     Anemia: multifactorial with possible causes including chronic blood loss, hyper/hypothyroidism, nutritional deficiency, infection/chronic inflammation, hemolysis, underlying bone marrow disorders. Will check CBC  w differential, CMP, v iron/TIBC, ferritin, reticulocytes, fecal occult, blood smear, TSH,  LDH, haptoglobin, monoclonal gammopathy evaluation.   Follow up in 2 weeks to review results.   Family history of cancer, recommend genetic counseling. She prefers to defer  Orders Placed This Encounter  Procedures   Technologist smear review    Standing Status:   Future    Number of Occurrences:   1    Standing Expiration Date:   02/19/2022   CBC with Differential/Platelet    Standing Status:   Future    Number of Occurrences:   1    Standing Expiration Date:   02/19/2022   Retic Panel    Standing Status:   Future    Number of Occurrences:   1    Standing Expiration Date:   02/19/2022   Multiple Myeloma Panel (SPEP&IFE w/QIG)    Standing Status:   Future    Number of Occurrences:   1    Standing Expiration Date:   02/19/2022   Kappa/lambda light chains    Standing Status:   Future    Number of Occurrences:   1    Standing Expiration Date:   02/19/2022   Lactate dehydrogenase    Standing Status:   Future    Number of Occurrences:   1    Standing Expiration Date:   02/19/2022   TSH    Standing Status:   Future    Number of Occurrences:   1    Standing Expiration Date:   02/19/2022   Ferritin    Standing Status:   Future    Number of Occurrences:   1    Standing Expiration Date:   08/19/2021   Iron and TIBC    Standing Status:   Future    Number of  Occurrences:   1    Standing Expiration Date:   02/19/2022   Copper, serum    Standing Status:   Future    Number of Occurrences:   1    Standing Expiration Date:   02/19/2022   CBC with Differential/Platelet    Standing Status:   Future    Number of Occurrences:   1    Standing Expiration Date:   02/19/2022   Haptoglobin    Standing Status:   Future    Number of Occurrences:   1    Standing Expiration Date:   02/19/2022    All questions were answered. The patient knows to call the clinic with any problems questions or concerns. Cc. Lin Landsman, MD  Return of visit: 2 weeks Thank you for this kind referral and the opportunity to participate in the care of this patient. A copy of today's note is routed to referring provider    Earlie Server, MD, PhD 02/19/2021

## 2021-02-19 NOTE — Progress Notes (Signed)
Pt here to establish care.

## 2021-02-20 LAB — TECHNOLOGIST SMEAR REVIEW
Plt Morphology: NORMAL
RBC MORPHOLOGY: NORMAL
WBC MORPHOLOGY: NORMAL

## 2021-02-20 LAB — KAPPA/LAMBDA LIGHT CHAINS
Kappa free light chain: 32.4 mg/L — ABNORMAL HIGH (ref 3.3–19.4)
Kappa, lambda light chain ratio: 1.51 (ref 0.26–1.65)
Lambda free light chains: 21.4 mg/L (ref 5.7–26.3)

## 2021-02-20 LAB — HAPTOGLOBIN: Haptoglobin: 296 mg/dL (ref 33–346)

## 2021-02-21 LAB — COPPER, SERUM: Copper: 178 ug/dL — ABNORMAL HIGH (ref 80–158)

## 2021-02-24 LAB — MULTIPLE MYELOMA PANEL, SERUM
Albumin SerPl Elph-Mcnc: 3.4 g/dL (ref 2.9–4.4)
Albumin/Glob SerPl: 0.9 (ref 0.7–1.7)
Alpha 1: 0.3 g/dL (ref 0.0–0.4)
Alpha2 Glob SerPl Elph-Mcnc: 0.9 g/dL (ref 0.4–1.0)
B-Globulin SerPl Elph-Mcnc: 1.2 g/dL (ref 0.7–1.3)
Gamma Glob SerPl Elph-Mcnc: 1.5 g/dL (ref 0.4–1.8)
Globulin, Total: 3.9 g/dL (ref 2.2–3.9)
IgA: 294 mg/dL (ref 87–352)
IgG (Immunoglobin G), Serum: 1520 mg/dL (ref 586–1602)
IgM (Immunoglobulin M), Srm: 115 mg/dL (ref 26–217)
Total Protein ELP: 7.3 g/dL (ref 6.0–8.5)

## 2021-02-25 ENCOUNTER — Encounter: Payer: Self-pay | Admitting: Gastroenterology

## 2021-02-26 ENCOUNTER — Ambulatory Visit: Payer: Federal, State, Local not specified - PPO | Admitting: Anesthesiology

## 2021-02-26 ENCOUNTER — Encounter
Admission: RE | Disposition: A | Discharge status: Home / Self Care | Payer: Self-pay | Source: Home / Self Care | Attending: Gastroenterology

## 2021-02-26 ENCOUNTER — Ambulatory Visit
Admission: RE | Admit: 2021-02-26 | Discharge: 2021-02-26 | Disposition: A | Discharge status: Home / Self Care | Payer: Federal, State, Local not specified - PPO | Attending: Gastroenterology | Admitting: Gastroenterology

## 2021-02-26 ENCOUNTER — Encounter: Payer: Self-pay | Admitting: Gastroenterology

## 2021-02-26 DIAGNOSIS — K259 Gastric ulcer, unspecified as acute or chronic, without hemorrhage or perforation: Secondary | ICD-10-CM

## 2021-02-26 DIAGNOSIS — D649 Anemia, unspecified: Secondary | ICD-10-CM | POA: Diagnosis not present

## 2021-02-26 DIAGNOSIS — K2970 Gastritis, unspecified, without bleeding: Secondary | ICD-10-CM | POA: Diagnosis not present

## 2021-02-26 DIAGNOSIS — K922 Gastrointestinal hemorrhage, unspecified: Secondary | ICD-10-CM | POA: Diagnosis not present

## 2021-02-26 DIAGNOSIS — K319 Disease of stomach and duodenum, unspecified: Secondary | ICD-10-CM | POA: Diagnosis not present

## 2021-02-26 DIAGNOSIS — D500 Iron deficiency anemia secondary to blood loss (chronic): Secondary | ICD-10-CM | POA: Diagnosis not present

## 2021-02-26 HISTORY — PX: ESOPHAGOGASTRODUODENOSCOPY (EGD) WITH PROPOFOL: SHX5813

## 2021-02-26 SURGERY — ESOPHAGOGASTRODUODENOSCOPY (EGD) WITH PROPOFOL
Anesthesia: General

## 2021-02-26 MED ORDER — SODIUM CHLORIDE 0.9 % IV SOLN
INTRAVENOUS | Status: DC
  Administered 2021-02-26: 1000 mL via INTRAVENOUS

## 2021-02-26 MED ORDER — PROPOFOL 500 MG/50ML IV EMUL
INTRAVENOUS | Status: AC
  Filled 2021-02-26: qty 50

## 2021-02-26 MED ORDER — PROPOFOL 10 MG/ML IV BOLUS
INTRAVENOUS | Status: DC | PRN
  Administered 2021-02-26: 30 mg via INTRAVENOUS
  Administered 2021-02-26: 100 mg via INTRAVENOUS

## 2021-02-26 MED ORDER — OMEPRAZOLE 40 MG PO CPDR: 40.0000 mg | DELAYED_RELEASE_CAPSULE | Freq: Every day | ORAL | 2 refills | Status: AC

## 2021-02-26 MED ORDER — LIDOCAINE HCL (PF) 2 % IJ SOLN
INTRAMUSCULAR | Status: AC
  Filled 2021-02-26: qty 5

## 2021-02-26 MED ORDER — PROPOFOL 10 MG/ML IV BOLUS
INTRAVENOUS | Status: AC
  Filled 2021-02-26: qty 20

## 2021-02-26 NOTE — Anesthesia Preprocedure Evaluation (Signed)
Anesthesia Evaluation  Patient identified by MRN, date of birth, ID band Patient awake    Reviewed: Allergy & Precautions, NPO status , Patient's Chart, lab work & pertinent test results  History of Anesthesia Complications Negative for: history of anesthetic complications  Airway Mallampati: III  TM Distance: >3 FB Neck ROM: Full    Dental  (+) Missing,    Pulmonary neg sleep apnea, neg COPD, Not current smoker,    Pulmonary exam normal        Cardiovascular Exercise Tolerance: Good (-) hypertension(-) Past MI and (-) CHF Normal cardiovascular exam(-) dysrhythmias (-) Valvular Problems/Murmurs     Neuro/Psych neg Seizures Anxiety    GI/Hepatic Neg liver ROS, neg GERD  ,  Endo/Other  neg diabetes  Renal/GU negative Renal ROS     Musculoskeletal   Abdominal (+) + obese,   Peds  Hematology  (+) Blood dyscrasia, anemia ,   Anesthesia Other Findings Obese   Reproductive/Obstetrics                             Anesthesia Physical  Anesthesia Plan  ASA: II  Anesthesia Plan: General   Post-op Pain Management:    Induction: Intravenous  PONV Risk Score and Plan: 3 and Propofol infusion, TIVA and Treatment may vary due to age or medical condition  Airway Management Planned: Nasal Cannula  Additional Equipment:   Intra-op Plan:   Post-operative Plan:   Informed Consent: I have reviewed the patients History and Physical, chart, labs and discussed the procedure including the risks, benefits and alternatives for the proposed anesthesia with the patient or authorized representative who has indicated his/her understanding and acceptance.     Dental Advisory Given  Plan Discussed with: Anesthesiologist, CRNA and Surgeon  Anesthesia Plan Comments:         Anesthesia Quick Evaluation

## 2021-02-26 NOTE — Transfer of Care (Signed)
Immediate Anesthesia Transfer of Care Note  Patient: Maria Buck  Procedure(s) Performed: ESOPHAGOGASTRODUODENOSCOPY (EGD) WITH PROPOFOL  Patient Location: PACU and Endoscopy Unit  Anesthesia Type:General  Level of Consciousness: drowsy and patient cooperative  Airway & Oxygen Therapy: Patient Spontanous Breathing  Post-op Assessment: Report given to RN and Post -op Vital signs reviewed and stable  Post vital signs: Reviewed and stable  Last Vitals:  Vitals Value Taken Time  BP 121/80 02/26/21 0940  Temp 36.4 C 02/26/21 0940  Pulse 67 02/26/21 0943  Resp 19 02/26/21 0943  SpO2 99 % 02/26/21 0943  Vitals shown include unvalidated device data.  Last Pain:  Vitals:   02/26/21 0940  TempSrc: Temporal  PainSc: Asleep         Complications: No notable events documented.

## 2021-02-26 NOTE — Op Note (Signed)
[facility] Gastroenterology Patient Name: Maria Buck Procedure Date: 02/26/2021 9:18 AM MRN: [id number] Account #: [id number] Date of Birth: [birth year] Admit Type: Outpatient Age: 55 Room: [facility] ENDO ROOM 4 Gender: Female Note Status: Finalized Instrument Name: Upper Endoscope [id number] Procedure:             Upper GI endoscopy Indications:           Iron deficiency anemia secondary to chronic blood loss Providers:             Lin Landsman MD, MD Referring MD:          No Local Md, MD (Referring MD) Medicines:             General Anesthesia Complications:         No immediate complications. Estimated blood loss: None. Procedure:             Pre-Anesthesia Assessment:                        - Prior to the procedure, a History and Physical was                         performed, and patient medications and allergies were                         reviewed. The patient is competent. The risks and                         benefits of the procedure and the sedation options and                         risks were discussed with the patient. All questions                         were answered and informed consent was obtained.                         Patient identification and proposed procedure were                         verified by the physician, the nurse, the                         anesthesiologist, the anesthetist and the technician                         in the pre-procedure area in the procedure room in the                         endoscopy suite. Mental Status Examination: alert and                         oriented. Airway Examination: normal oropharyngeal                         airway and neck mobility. Respiratory Examination:                         clear to auscultation. CV Examination: normal.  Prophylactic Antibiotics: The patient does not require                         prophylactic antibiotics. Prior Anticoagulants: The                          patient has taken no previous anticoagulant or                         antiplatelet agents. ASA Grade Assessment: II - A                         patient with mild systemic disease. After reviewing                         the risks and benefits, the patient was deemed in                         satisfactory condition to undergo the procedure. The                         anesthesia plan was to use general anesthesia.                         Immediately prior to administration of medications,                         the patient was re-assessed for adequacy to receive                         sedatives. The heart rate, respiratory rate, oxygen                         saturations, blood pressure, adequacy of pulmonary                         ventilation, and response to care were monitored                         throughout the procedure. The physical status of the                         patient was re-assessed after the procedure.                        After obtaining informed consent, the endoscope was                         passed under direct vision. Throughout the procedure,                         the patient's blood pressure, pulse, and oxygen                         saturations were monitored continuously. The Endoscope                         was introduced through the mouth, and advanced to the  second part of duodenum. The upper GI endoscopy was                         accomplished without difficulty. The patient tolerated                         the procedure well. Findings:      The duodenal bulb and second portion of the duodenum were normal.      Multiple dispersed 5 mm erosions with stigmata of recent bleeding were       found in the gastric body, at the incisura and in the gastric antrum.       Biopsies were taken with a cold forceps for Helicobacter pylori testing.      The cardia and gastric fundus were normal on retroflexion.       Esophagogastric landmarks were identified: the gastroesophageal junction       was found at 35 cm from the incisors.      The gastroesophageal junction and examined esophagus were normal. Impression:            - Normal duodenal bulb and second portion of the                         duodenum.                        - Erosive gastropathy with stigmata of recent                         bleeding. Biopsied.                        - Esophagogastric landmarks identified.                        - Normal gastroesophageal junction and esophagus. Recommendation:        - Discharge patient to home (with escort).                        - Resume previous diet today.                        - Continue present medications.                        - Await pathology results.                        - Use Prilosec (omeprazole) 40 mg PO daily for 3                         months. Procedure Code(s):     --- Professional ---                        [phone], Esophagogastroduodenoscopy, flexible,                         transoral; with biopsy, single or multiple Diagnosis Code(s):     --- Professional ---                        K92.2,  Gastrointestinal hemorrhage, unspecified                        D50.0, Iron deficiency anemia secondary to blood loss                         (chronic) CPT copyright 2019 American Medical Association. All rights reserved. The codes documented in this report are preliminary and upon coder review may  be revised to meet current compliance requirements. Dr. Ulyess Mort Lin Landsman MD, MD 02/26/2021 9:38:18 AM This report has been signed electronically. Number of Addenda: 0 Note Initiated On: 02/26/2021 9:18 AM Estimated Blood Loss:  Estimated blood loss: none.      [facility]

## 2021-02-26 NOTE — H&P (Signed)
Cephas Darby, MD [address]  [place]  [place], [place] [address]  Main: [phone]  Fax: [phone] Pager: [phone]  Primary Care Physician:  System, Provider Not In Primary Gastroenterologist:  Dr. Cephas Darby  Pre-Procedure History & Physical: HPI:  Maria Buck is a 55 y.o. female is here for an endoscopy.   Past Medical History:  Diagnosis Date   Medical history non-contributory     Past Surgical History:  Procedure Laterality Date   COLONOSCOPY N/A 02/03/2021   Procedure: COLONOSCOPY;  Surgeon: Lin Landsman, MD;  Location: [facility] ENDOSCOPY;  Service: Gastroenterology;  Laterality: N/A;   COLONOSCOPY WITH PROPOFOL N/A 10/16/2019   Procedure: COLONOSCOPY WITH PROPOFOL;  Surgeon: Lin Landsman, MD;  Location: [facility] ENDOSCOPY;  Service: Gastroenterology;  Laterality: N/A;   NO PAST SURGERIES      Prior to Admission medications   Medication Sig Start Date End Date Taking? Authorizing Provider  Cholecalciferol (VITAMIN D3 GUMMIES PO) Take by mouth.   Yes [provider]  Cyanocobalamin (VITAMIN B-12 PO) Take by mouth.   Yes [provider]  ferrous sulfate 325 (65 FE) MG tablet Take 325 mg by mouth daily with breakfast.   Yes [provider]  Multiple Vitamin (MULTIVITAMIN ADULT PO) Take by mouth.   Yes [provider]  TURMERIC PO Take by mouth.   Yes [provider]    Allergies as of 02/11/2021   (No Known Allergies)    Family History  Problem Relation Age of Onset   Diabetes Father    Heart attack Father    Deep vein thrombosis Father    Kidney failure Father    Diabetes Sister    Thyroid cancer Sister    Cervical cancer Maternal Aunt    Heart attack Maternal Aunt    Ovarian cancer Maternal Aunt    Heart Problems Maternal Uncle    Prostate cancer Maternal Uncle    Diabetes Paternal Aunt    Deep vein thrombosis Paternal Uncle    Breast cancer Maternal Grandmother 76   Heart attack  Maternal Grandmother    Diabetes Paternal Grandmother    Prostate cancer Paternal Grandfather    Deep vein thrombosis Cousin    Colon cancer Neg Hx     Social History   Socioeconomic History   Marital status: Single    Spouse name: Not on file   Number of children: Not on file   Years of education: Not on file   Highest education level: Not on file  Occupational History   Not on file  Tobacco Use   Smoking status: Never   Smokeless tobacco: Never  Vaping Use   Vaping Use: Never used  Substance and Sexual Activity   Alcohol use: Yes    Comment: rare    Drug use: Never   Sexual activity: Yes  Other Topics Concern   Not on file  Social History Narrative   Not on file   Social Determinants of Health   Financial Resource Strain: Not on file  Food Insecurity: Not on file  Transportation Needs: Not on file  Physical Activity: Not on file  Stress: Not on file  Social Connections: Not on file  Intimate Partner Violence: Not on file    Review of Systems: See HPI, otherwise negative ROS  Physical Exam: BP (!) 138/97   Pulse 73   Temp (!) 96.5 F (35.8 C) (Temporal)   Resp 18   Ht 5\' 9"  (1.753 m)  Wt 102.3 kg   LMP 06/02/2016 (Approximate) Comment: no chance of preg  SpO2 98%   BMI 33.30 kg/m  General:   Alert,  pleasant and cooperative in NAD Head:  Normocephalic and atraumatic. Neck:  Supple; no masses or thyromegaly. Lungs:  Clear throughout to auscultation.    Heart:  Regular rate and rhythm. Abdomen:  Soft, nontender and nondistended. Normal bowel sounds, without guarding, and without rebound.   Neurologic:  Alert and  oriented x4;  grossly normal neurologically.  Impression/Plan: [name] is here for an endoscopy to be performed for h/o normocytic anemia  Risks, benefits, limitations, and alternatives regarding  endoscopy have been reviewed with the patient.  Questions have been answered.  All parties agreeable.   Sherri Sear, MD  02/26/2021,  7:53 AM

## 2021-02-27 ENCOUNTER — Encounter: Payer: Self-pay | Admitting: Gastroenterology

## 2021-02-27 LAB — SURGICAL PATHOLOGY

## 2021-02-27 NOTE — Anesthesia Postprocedure Evaluation (Signed)
Anesthesia Post Note  Patient: Maria Buck  Procedure(s) Performed: ESOPHAGOGASTRODUODENOSCOPY (EGD) WITH PROPOFOL  Patient location during evaluation: Endoscopy Anesthesia Type: General Level of consciousness: awake and alert Pain management: pain level controlled Vital Signs Assessment: post-procedure vital signs reviewed and stable Respiratory status: spontaneous breathing, nonlabored ventilation and respiratory function stable Cardiovascular status: blood pressure returned to baseline and stable Postop Assessment: no apparent nausea or vomiting Anesthetic complications: no   No notable events documented.   Last Vitals:  Vitals:   02/26/21 1000 02/26/21 1010  BP: 103/83 (!) 129/93  Pulse: 67 68  Resp: 20 20  Temp:    SpO2: 100% 100%    Last Pain:  Vitals:   02/27/21 0741  TempSrc:   PainSc: 0-No pain                 [place] Ouch

## 2021-03-01 ENCOUNTER — Encounter: Payer: Self-pay | Admitting: Emergency Medicine

## 2021-03-01 ENCOUNTER — Other Ambulatory Visit: Payer: Self-pay

## 2021-03-01 ENCOUNTER — Ambulatory Visit
Admission: EM | Admit: 2021-03-01 | Discharge: 2021-03-01 | Disposition: A | Discharge status: Home / Self Care | Payer: Federal, State, Local not specified - PPO | Attending: Family Medicine | Admitting: Family Medicine

## 2021-03-01 DIAGNOSIS — L139 Bullous disorder, unspecified: Secondary | ICD-10-CM

## 2021-03-01 DIAGNOSIS — W57XXXA Bitten or stung by nonvenomous insect and other nonvenomous arthropods, initial encounter: Secondary | ICD-10-CM | POA: Diagnosis not present

## 2021-03-01 DIAGNOSIS — L309 Dermatitis, unspecified: Secondary | ICD-10-CM

## 2021-03-01 MED ORDER — DOXYCYCLINE HYCLATE 100 MG PO CAPS: 100.0000 mg | ORAL_CAPSULE | Freq: Two times a day (BID) | ORAL | 0 refills | Status: AC

## 2021-03-01 MED ORDER — TRIAMCINOLONE ACETONIDE 0.025 % EX OINT: 1.0000 "application " | TOPICAL_OINTMENT | Freq: Three times a day (TID) | CUTANEOUS | 0 refills | Status: DC

## 2021-03-01 NOTE — ED Provider Notes (Signed)
[facility]    CSN: [id number] Arrival date & time: 03/01/21  1330      History   Chief Complaint Chief Complaint  Patient presents with   Insect Bite   Rash    HPI Maria Buck is a 55 y.o. female.   HPI Patient here with skin eruptions right upper arm and lower forearm. Uncertain of what she may have been bitten by. She applied alcohol and noticed that the area of redness has increased in diameter. She also has a rash involving both forearms present for over one month which comes and goes. She has treated with benadryl topical cream without relief. No known exposure to any irritant .  No history of skin disorders Past Medical History:  Diagnosis Date   Medical history non-contributory     Patient Active Problem List   Diagnosis Date Noted   Normocytic anemia 02/19/2021   Thrombocytopenia ([place]) 12/25/2020   Colon cancer screening    Hot flashes 01/17/2019   Chest pain with low risk for cardiac etiology 07/28/2018   Anxiety state 04/21/2016   Vertigo 04/21/2016   Megaloblastic anemia 04/21/2016    Past Surgical History:  Procedure Laterality Date   COLONOSCOPY N/A 02/03/2021   Procedure: COLONOSCOPY;  Surgeon: Lin Landsman, MD;  Location: [facility] ENDOSCOPY;  Service: Gastroenterology;  Laterality: N/A;   COLONOSCOPY WITH PROPOFOL N/A 10/16/2019   Procedure: COLONOSCOPY WITH PROPOFOL;  Surgeon: Lin Landsman, MD;  Location: [facility] ENDOSCOPY;  Service: Gastroenterology;  Laterality: N/A;   ESOPHAGOGASTRODUODENOSCOPY (EGD) WITH PROPOFOL N/A 02/26/2021   Procedure: ESOPHAGOGASTRODUODENOSCOPY (EGD) WITH PROPOFOL;  Surgeon: Lin Landsman, MD;  Location: [facility] ENDOSCOPY;  Service: Gastroenterology;  Laterality: N/A;   NO PAST SURGERIES      OB History     Gravida  0   Para  0   Term  0   Preterm  0   AB  0   Living  0      SAB  0   IAB  0   Ectopic  0   Multiple  0   Live Births  0            Home Medications     Prior to Admission medications   Medication Sig Start Date End Date Taking? Authorizing Provider  doxycycline (VIBRAMYCIN) 100 MG capsule Take 1 capsule (100 mg total) by mouth 2 (two) times daily for 7 days. 03/01/21 03/08/21 Yes Scot Jun, FNP  triamcinolone (KENALOG) 0.025 % ointment Apply 1 application topically 3 (three) times daily. Apply to affected area only. 03/01/21  Yes Scot Jun, FNP  Cholecalciferol (VITAMIN D3 GUMMIES PO) Take by mouth.    [provider]  Cyanocobalamin (VITAMIN B-12 PO) Take by mouth.    [provider]  ferrous sulfate 325 (65 FE) MG tablet Take 325 mg by mouth daily with breakfast.    [provider]  Multiple Vitamin (MULTIVITAMIN ADULT PO) Take by mouth.    [provider]  omeprazole (PRILOSEC) 40 MG capsule Take 1 capsule (40 mg total) by mouth daily before breakfast. 02/26/21 03/28/21  Lin Landsman, MD  TURMERIC PO Take by mouth.    [provider]    Family History Family History  Problem Relation Age of Onset   Diabetes Father    Heart attack Father    Deep vein thrombosis Father    Kidney failure Father    Diabetes Sister    Thyroid cancer Sister  Cervical cancer Maternal Aunt    Heart attack Maternal Aunt    Ovarian cancer Maternal Aunt    Heart Problems Maternal Uncle    Prostate cancer Maternal Uncle    Diabetes Paternal Aunt    Deep vein thrombosis Paternal Uncle    Breast cancer Maternal Grandmother 21   Heart attack Maternal Grandmother    Diabetes Paternal Grandmother    Prostate cancer Paternal Grandfather    Deep vein thrombosis Cousin    Colon cancer Neg Hx     Social History Social History   Tobacco Use   Smoking status: Never   Smokeless tobacco: Never  Vaping Use   Vaping Use: Never used  Substance Use Topics   Alcohol use: Yes    Comment: rare    Drug use: Never     Allergies   Patient has no known allergies.   Review of  Systems Review of Systems Pertinent negatives listed in HPI   Physical Exam Triage Vital Signs ED Triage Vitals  Enc Vitals Group     BP 03/01/21 1341 (!) 148/89     Pulse Rate 03/01/21 1341 80     Resp 03/01/21 1341 18     Temp 03/01/21 1341 97.9 F (36.6 C)     Temp Source 03/01/21 1341 Oral     SpO2 03/01/21 1341 95 %     Weight --      Height --      Head Circumference --      Peak Flow --      Pain Score 03/01/21 1352 0     Pain Loc --      Pain Edu? --      Excl. in [place]? --    No data found.  Updated Vital Signs BP (!) 148/89 (BP Location: Left Arm)   Pulse 80   Temp 97.9 F (36.6 C) (Oral)   Resp 18   LMP 06/02/2016 (Approximate) Comment: no chance of preg  SpO2 95%   Visual Acuity Right Eye Distance:   Left Eye Distance:   Bilateral Distance:    Right Eye Near:   Left Eye Near:    Bilateral Near:     Physical Exam General appearance: alert, well developed, well nourished, cooperative and in no distress Head: Normocephalic, without obvious abnormality, atraumatic Respiratory: Respirations even and unlabored, normal respiratory rate Heart: rate and rhythm normal. No gallop or murmurs noted on exam  Abdomen: BS +, no distention, no rebound tenderness, or no mass Extremities: No gross deformities Skin: Bulous rash  Right upper arm and left forearm. Macular rash bilateral forearms Skin color, texture, turgor normal. No rashes seen  Psych: Appropriate mood and affect. Neurologic: GCS 15, normal coordination, norma gait.   UC Treatments / Results  Labs (all labs ordered are listed, but only abnormal results are displayed) Labs Reviewed - No data to display  EKG   Radiology No results found.  Procedures Procedures (including critical care time)  Medications Ordered in UC Medications - No data to display  Initial Impression / Assessment and Plan / UC Course  I have reviewed the triage vital signs and the nursing notes.  Pertinent labs &  imaging results that were available during my care of the patient were reviewed by me and considered in my medical decision making (see chart for details).     Dermatitis, treatment with triamcinolone cream BID PRN Bullous lesion secondary insect bite, doxycyline and apply triamcinolone cream. RTC PRN Final Clinical Impressions(s) /  UC Diagnoses   Final diagnoses:  Dermatitis  Bullous eruption, localized  Insect bite, unspecified site, initial encounter   Discharge Instructions   None    ED Prescriptions     Medication Sig Dispense Auth. Provider   doxycycline (VIBRAMYCIN) 100 MG capsule Take 1 capsule (100 mg total) by mouth 2 (two) times daily for 7 days. 14 capsule Scot Jun, FNP   triamcinolone (KENALOG) 0.025 % ointment Apply 1 application topically 3 (three) times daily. Apply to affected area only. 454 g Scot Jun, FNP      PDMP not reviewed this encounter.   Scot Jun, FNP 03/01/21 1450

## 2021-03-01 NOTE — ED Triage Notes (Signed)
Pt here with 2 large bullseye type rashes on both arms. Does not recall a tick bites. [place] and also a had a more diffuse rash then and still states it is itchy and painful on forearms.

## 2021-03-07 ENCOUNTER — Encounter: Payer: Self-pay | Admitting: Oncology

## 2021-03-07 ENCOUNTER — Inpatient Hospital Stay: Payer: Federal, State, Local not specified - PPO

## 2021-03-07 ENCOUNTER — Inpatient Hospital Stay: Payer: Federal, State, Local not specified - PPO | Admitting: Oncology

## 2021-03-07 VITALS — BP 109/63 | HR 65 | Temp 97.8°F | Resp 18 | Wt 222.0 lb

## 2021-03-07 DIAGNOSIS — Z803 Family history of malignant neoplasm of breast: Secondary | ICD-10-CM | POA: Diagnosis not present

## 2021-03-07 DIAGNOSIS — D649 Anemia, unspecified: Secondary | ICD-10-CM

## 2021-03-07 DIAGNOSIS — Z809 Family history of malignant neoplasm, unspecified: Secondary | ICD-10-CM | POA: Diagnosis not present

## 2021-03-07 DIAGNOSIS — R5383 Other fatigue: Secondary | ICD-10-CM | POA: Diagnosis not present

## 2021-03-07 DIAGNOSIS — Z808 Family history of malignant neoplasm of other organs or systems: Secondary | ICD-10-CM | POA: Diagnosis not present

## 2021-03-07 DIAGNOSIS — Z8042 Family history of malignant neoplasm of prostate: Secondary | ICD-10-CM | POA: Diagnosis not present

## 2021-03-07 DIAGNOSIS — R790 Abnormal level of blood mineral: Secondary | ICD-10-CM | POA: Diagnosis not present

## 2021-03-07 DIAGNOSIS — Z8041 Family history of malignant neoplasm of ovary: Secondary | ICD-10-CM | POA: Diagnosis not present

## 2021-03-07 LAB — CBC WITH DIFFERENTIAL/PLATELET
Abs Immature Granulocytes: 0.03 10*3/uL (ref 0.00–0.07)
Basophils Absolute: 0 10*3/uL (ref 0.0–0.1)
Basophils Relative: 0 %
Eosinophils Absolute: 0.2 10*3/uL (ref 0.0–0.5)
Eosinophils Relative: 3 %
HCT: 35.2 % — ABNORMAL LOW (ref 36.0–46.0)
Hemoglobin: 11.5 g/dL — ABNORMAL LOW (ref 12.0–15.0)
Immature Granulocytes: 0 %
Lymphocytes Relative: 31 %
Lymphs Abs: 2.5 10*3/uL (ref 0.7–4.0)
MCH: 32.1 pg (ref 26.0–34.0)
MCHC: 32.7 g/dL (ref 30.0–36.0)
MCV: 98.3 fL (ref 80.0–100.0)
Monocytes Absolute: 0.5 10*3/uL (ref 0.1–1.0)
Monocytes Relative: 6 %
Neutro Abs: 4.7 10*3/uL (ref 1.7–7.7)
Neutrophils Relative %: 60 %
Platelets: 356 10*3/uL (ref 150–400)
RBC: 3.58 MIL/uL — ABNORMAL LOW (ref 3.87–5.11)
RDW: 11.4 % — ABNORMAL LOW (ref 11.5–15.5)
WBC: 8 10*3/uL (ref 4.0–10.5)
nRBC: 0 % (ref 0.0–0.2)

## 2021-03-07 NOTE — Progress Notes (Signed)
Hematology/Oncology progress note [facility] [phone] [phone]   Patient Care Team: Pccm, Ander Gaster, MD as PCP - General (Internal Medicine)  REFERRING PROVIDER: Elby Beck, FNP  CHIEF COMPLAINTS/REASON FOR VISIT:  Follow up for anemia  HISTORY OF PRESENTING ILLNESS:  Maria Buck is a  55 y.o.  female with PMH listed below who was referred to me for evaluation of anemia Reviewed patient's recent labs that was done.  02/03/2021 Labs revealed anemia with hemoglobin of 10.8, MCV 99   Reviewed patient's previous labs ordered by primary care physician's office, anemia is chronic onset , duration is since July 2022 No aggravating or improving factors.  Associated signs and symptoms: Patient reports fatigue.  Denies SOB with exertion.  Denies weight loss, easy bruising, hematochezia, hemoptysis, hematuria. 02/03/2021 colonoscopy - normal examination.  02/03/2021 Iron panel showed ferritin 110, iron saturation 12,  01/06/2021 B12 1550 folate 19.4  INTERVAL HISTORY Maria Buck is a 55 y.o. female who has above history reviewed by me today presents for follow up visit for anemia Patient had blood work done after last visit and presents to discuss results   Review of Systems  Constitutional:  Negative for appetite change, chills, fatigue and fever.  HENT:   Negative for hearing loss and voice change.   Eyes:  Negative for eye problems.  Respiratory:  Negative for chest tightness and cough.   Cardiovascular:  Negative for chest pain.  Gastrointestinal:  Negative for abdominal distention, abdominal pain and blood in stool.  Endocrine: Negative for hot flashes.  Genitourinary:  Negative for difficulty urinating and frequency.   Musculoskeletal:  Negative for arthralgias.  Skin:  Negative for itching and rash.  Neurological:  Negative for extremity weakness.  Hematological:  Negative for adenopathy.  Psychiatric/Behavioral:   Negative for confusion.     MEDICAL HISTORY:  Past Medical History:  Diagnosis Date   Medical history non-contributory     SURGICAL HISTORY: Past Surgical History:  Procedure Laterality Date   COLONOSCOPY N/A 02/03/2021   Procedure: COLONOSCOPY;  Surgeon: Lin Landsman, MD;  Location: [facility] ENDOSCOPY;  Service: Gastroenterology;  Laterality: N/A;   COLONOSCOPY WITH PROPOFOL N/A 10/16/2019   Procedure: COLONOSCOPY WITH PROPOFOL;  Surgeon: Lin Landsman, MD;  Location: [facility] ENDOSCOPY;  Service: Gastroenterology;  Laterality: N/A;   ESOPHAGOGASTRODUODENOSCOPY (EGD) WITH PROPOFOL N/A 02/26/2021   Procedure: ESOPHAGOGASTRODUODENOSCOPY (EGD) WITH PROPOFOL;  Surgeon: Lin Landsman, MD;  Location: [facility] ENDOSCOPY;  Service: Gastroenterology;  Laterality: N/A;   NO PAST SURGERIES      SOCIAL HISTORY: Social History   Socioeconomic History   Marital status: Single    Spouse name: Not on file   Number of children: Not on file   Years of education: Not on file   Highest education level: Not on file  Occupational History   Not on file  Tobacco Use   Smoking status: Never   Smokeless tobacco: Never  Vaping Use   Vaping Use: Never used  Substance and Sexual Activity   Alcohol use: Yes    Comment: rare    Drug use: Never   Sexual activity: Yes  Other Topics Concern   Not on file  Social History Narrative   Not on file   Social Determinants of Health   Financial Resource Strain: Not on file  Food Insecurity: Not on file  Transportation Needs: Not on file  Physical Activity: Not on file  Stress: Not on file  Social Connections: Not on file  Intimate Partner Violence: Not on file    FAMILY HISTORY: Family History  Problem Relation Age of Onset   Diabetes Father    Heart attack Father    Deep vein thrombosis Father    Kidney failure Father    Diabetes Sister    Thyroid cancer Sister    Cervical cancer Maternal Aunt    Heart attack Maternal Aunt    Ovarian  cancer Maternal Aunt    Heart Problems Maternal Uncle    Prostate cancer Maternal Uncle    Diabetes Paternal Aunt    Deep vein thrombosis Paternal Uncle    Breast cancer Maternal Grandmother 62   Heart attack Maternal Grandmother    Diabetes Paternal Grandmother    Prostate cancer Paternal Grandfather    Deep vein thrombosis Cousin    Colon cancer Neg Hx     ALLERGIES:  has No Known Allergies.  MEDICATIONS:  Current Outpatient Medications  Medication Sig Dispense Refill   Cholecalciferol (VITAMIN D3 GUMMIES PO) Take by mouth.     Cyanocobalamin (VITAMIN B-12 PO) Take by mouth.     doxycycline (VIBRAMYCIN) 100 MG capsule Take 1 capsule (100 mg total) by mouth 2 (two) times daily for 7 days. 14 capsule 0   ferrous sulfate 325 (65 FE) MG tablet Take 325 mg by mouth daily with breakfast.     Multiple Vitamin (MULTIVITAMIN ADULT PO) Take by mouth.     omeprazole (PRILOSEC) 40 MG capsule Take 1 capsule (40 mg total) by mouth daily before breakfast. 30 capsule 2   triamcinolone (KENALOG) 0.025 % ointment Apply 1 application topically 3 (three) times daily. Apply to affected area only. 454 g 0   TURMERIC PO Take by mouth.     No current facility-administered medications for this visit.     PHYSICAL EXAMINATION: ECOG PERFORMANCE STATUS: 0 - Asymptomatic Vitals:   03/07/21 1017  BP: 109/63  Pulse: 65  Resp: 18  Temp: 97.8 F (36.6 C)  SpO2: 96%   Filed Weights   03/07/21 1017  Weight: 222 lb (100.7 kg)    Physical Exam Constitutional:      General: She is not in acute distress. HENT:     Head: Normocephalic and atraumatic.  Eyes:     General: No scleral icterus. Cardiovascular:     Rate and Rhythm: Normal rate and regular rhythm.     Heart sounds: Normal heart sounds.  Pulmonary:     Effort: Pulmonary effort is normal. No respiratory distress.     Breath sounds: No wheezing.  Abdominal:     General: Bowel sounds are normal. There is no distension.     Palpations:  Abdomen is soft.  Musculoskeletal:        General: No deformity. Normal range of motion.     Cervical back: Normal range of motion and neck supple.  Skin:    General: Skin is warm and dry.     Findings: No erythema or rash.  Neurological:     Mental Status: She is alert and oriented to person, place, and time. Mental status is at baseline.     Cranial Nerves: No cranial nerve deficit.     Coordination: Coordination normal.  Psychiatric:        Mood and Affect: Mood normal.     LABORATORY DATA:  I have reviewed the data as listed Lab Results  Component Value Date   WBC 8.0 03/07/2021   HGB 11.5 (L) 03/07/2021   HCT 35.2 (L) 03/07/2021  MCV 98.3 03/07/2021   PLT 356 03/07/2021   Recent Labs    12/19/20 1000  NA 139  K 4.4  CL 100  CO2 21  GLUCOSE 90  BUN 10  CREATININE 0.84  CALCIUM 9.8  PROT 8.0  ALBUMIN 4.4  AST 24  ALT 15  ALKPHOS 92  BILITOT 0.4    Iron/TIBC/Ferritin/ %Sat    Component Value Date/Time   IRON 66 02/19/2021 1556   TIBC 312 02/19/2021 1556   FERRITIN 94 02/19/2021 1556   IRONPCTSAT 21 02/19/2021 1556         ASSESSMENT & PLAN:  1. Normocytic anemia   2. Family history of cancer   3. Abnormal blood level of copper    Labs are reviewed and discussed with patient. 9/14/2 cbc showed normal hemoglobin, normal haptoglobin, normal ferritin, iron saturation, normal TSH, normal LDH,  Normal light chain ration, slightly increased kappa light chain- non specific.negative M protein.  Normal retic %, retic hemoglobin  Work up is negative. Patient prefers to have blood work repeated to ensure the complete resolution of her blood count.   Repeat cbc showed slight decrease of hemoglobin of 11.5 02/26/2021 EGD showed erosive gastropathy with stigmata of recent bleeding. Biopsy showed healing mucosa injury. Negative for H pylori, dysplasia, malignancy.  Dr.Vanga suggests patient to take omeprazole 40mg  daily.   # Elevated copper level,  appreciate GI input. Will forward result to Dr.Vanga.  Recommend patient to have drinking water tested or call water company.    Family history of cancer, recommend genetic counseling. She prefers to defer  Orders Placed This Encounter  Procedures   CBC with Differential/Platelet    Standing Status:   Future    Number of Occurrences:   1    Standing Expiration Date:   03/07/2022    All questions were answered. The patient knows to call the clinic with any problems questions or concerns. Secundino Ginger, FNP  Follow up 3 months with NP.    Earlie Server, MD, PhD 03/07/2021

## 2021-03-10 ENCOUNTER — Telehealth: Payer: Self-pay

## 2021-03-10 DIAGNOSIS — D649 Anemia, unspecified: Secondary | ICD-10-CM

## 2021-03-10 NOTE — Telephone Encounter (Signed)
Pt informed and voiced understanding

## 2021-03-10 NOTE — Telephone Encounter (Signed)
Called to schedule appointments with patient. She stated she will call back to schedule.

## 2021-03-10 NOTE — Telephone Encounter (Signed)
-----   Message from Earlie Server, MD sent at 03/07/2021  7:31 PM EDT ----- Please let patient know that her blood count is slight lower than normal when re tested today. Probably due to intermittent blood loss from her stomach. Recommend her to continue omeprazole.  Follow up with [place] in 3 months, lab = check cbc iron tibc ferritin retic panel and see me or NP .same day is fine.

## 2021-05-07 ENCOUNTER — Other Ambulatory Visit: Payer: Self-pay

## 2021-05-07 ENCOUNTER — Inpatient Hospital Stay: Payer: Federal, State, Local not specified - PPO | Attending: Nurse Practitioner

## 2021-05-07 DIAGNOSIS — D649 Anemia, unspecified: Secondary | ICD-10-CM | POA: Diagnosis not present

## 2021-05-07 LAB — CBC WITH DIFFERENTIAL/PLATELET
Abs Immature Granulocytes: 0.01 10*3/uL (ref 0.00–0.07)
Basophils Absolute: 0 10*3/uL (ref 0.0–0.1)
Basophils Relative: 0 %
Eosinophils Absolute: 0.2 10*3/uL (ref 0.0–0.5)
Eosinophils Relative: 3 %
HCT: 42 % (ref 36.0–46.0)
Hemoglobin: 13.7 g/dL (ref 12.0–15.0)
Immature Granulocytes: 0 %
Lymphocytes Relative: 30 %
Lymphs Abs: 2.5 10*3/uL (ref 0.7–4.0)
MCH: 31.4 pg (ref 26.0–34.0)
MCHC: 32.6 g/dL (ref 30.0–36.0)
MCV: 96.3 fL (ref 80.0–100.0)
Monocytes Absolute: 0.6 10*3/uL (ref 0.1–1.0)
Monocytes Relative: 8 %
Neutro Abs: 4.8 10*3/uL (ref 1.7–7.7)
Neutrophils Relative %: 59 %
Platelets: 333 10*3/uL (ref 150–400)
RBC: 4.36 MIL/uL (ref 3.87–5.11)
RDW: 11.5 % (ref 11.5–15.5)
WBC: 8.2 10*3/uL (ref 4.0–10.5)
nRBC: 0 % (ref 0.0–0.2)

## 2021-05-07 LAB — IRON AND TIBC
Iron: 51 ug/dL (ref 28–170)
Saturation Ratios: 14 % (ref 10.4–31.8)
TIBC: 354 ug/dL (ref 250–450)
UIBC: 303 ug/dL

## 2021-05-07 LAB — FERRITIN: Ferritin: 130 ng/mL (ref 11–307)

## 2021-05-07 LAB — RETIC PANEL
Immature Retic Fract: 5.8 % (ref 2.3–15.9)
RBC.: 4.3 MIL/uL (ref 3.87–5.11)
Retic Count, Absolute: 87.7 10*3/uL (ref 19.0–186.0)
Retic Ct Pct: 2 % (ref 0.4–3.1)
Reticulocyte Hemoglobin: 35.9 pg (ref >27.9)

## 2021-05-15 ENCOUNTER — Ambulatory Visit: Payer: Federal, State, Local not specified - PPO | Admitting: Nurse Practitioner

## 2021-07-16 IMAGING — MR MR KNEE*R* W/O CM
6 series · 40 of 40 positions shown · non-contrast
Comparison: Plain films right knee 11/13/2019.

CLINICAL DATA: The patient suffered a right knee injury doing a
weighted lunge in August 2019. Continued pain.

EXAM:
MRI OF THE RIGHT KNEE WITHOUT CONTRAST
TECHNIQUE: Multiplanar, multisequence MR imaging of the knee was performed. No
intravenous contrast was administered.

[Series 3: T2 fat-sat · axial · 4.0mm · 0.53mm/px · z∈[-87,+83]mm · 8 of 35 slices shown (1 of 3)]
[im 1/35]
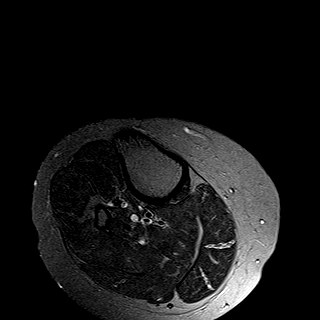
[im 5/35]
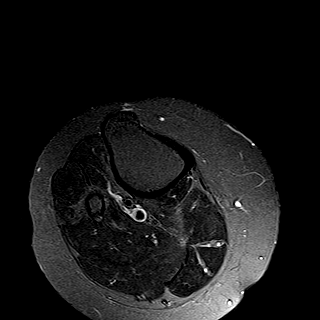
[im 10/35]
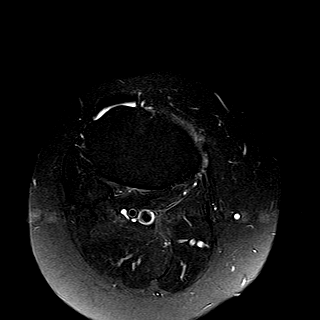
[im 15/35]
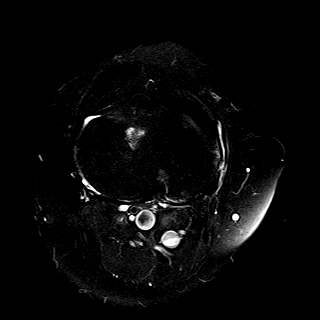
[im 20/35]
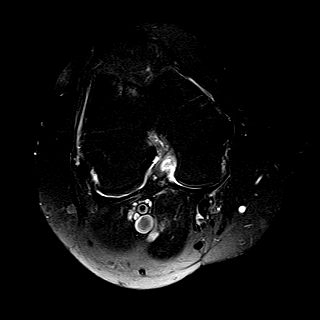
[im 25/35]
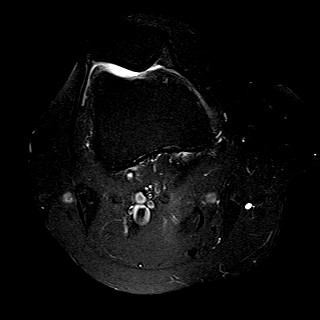
[im 30/35]
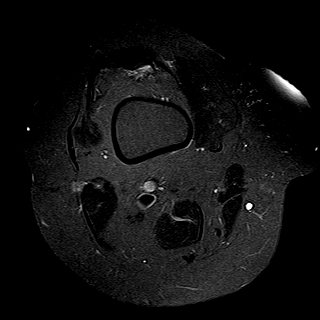
[im 35/35]
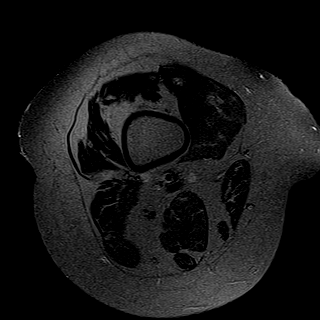

[Series 4: T1 · coronal · 4.0mm · 0.62mm/px · 6 of 31 slices shown]
[im 1/31]
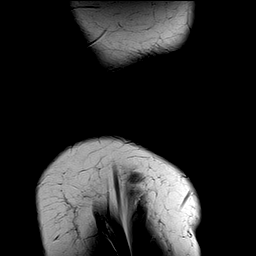
[im 7/31]
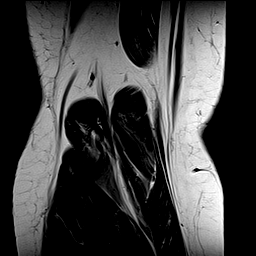
[im 13/31]
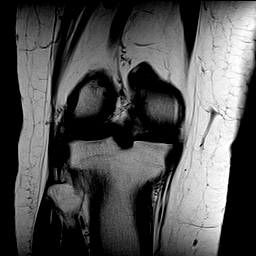
[im 19/31]
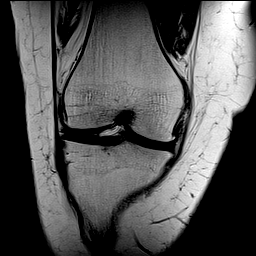
[im 25/31]
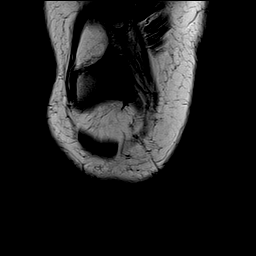
[im 31/31]
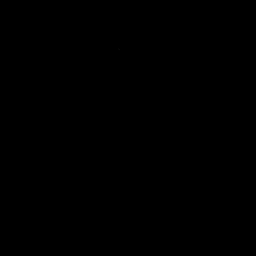

[Series 5: T2 fat-sat · coronal · 4.0mm · 0.62mm/px · 6 of 31 slices shown (2 of 3)]
[im 1/31]
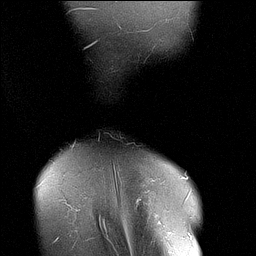
[im 7/31]
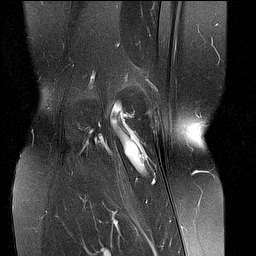
[im 13/31]
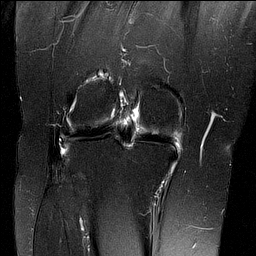
[im 19/31]
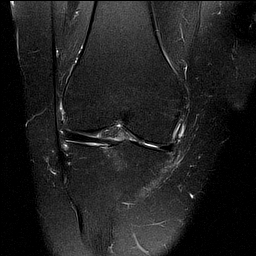
[im 25/31]
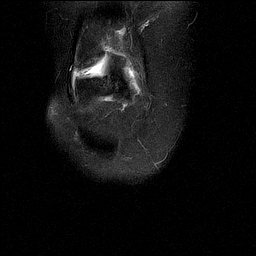
[im 31/31]
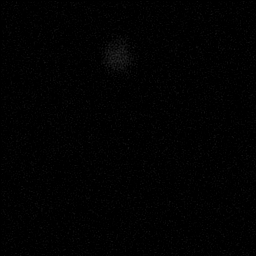

[Series 6: PD fat-sat · coronal · 4.0mm · 0.62mm/px · 6 of 31 slices shown (1 of 2)]
[im 1/31]
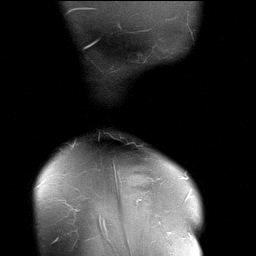
[im 7/31]
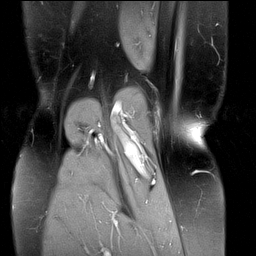
[im 13/31]
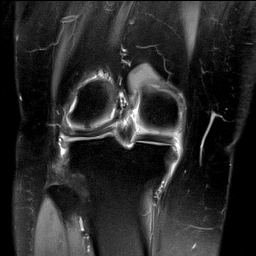
[im 19/31]
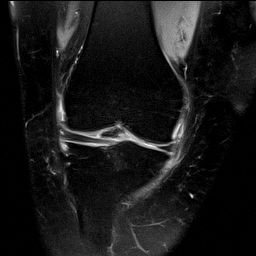
[im 25/31]
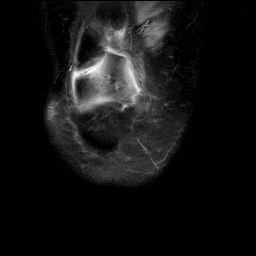
[im 31/31]
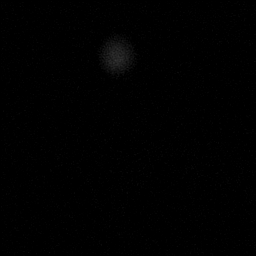

[Series 7: PD fat-sat · sagittal · 3.0mm · 0.62mm/px · 7 of 35 slices shown (2 of 2)]
[im 1/35]
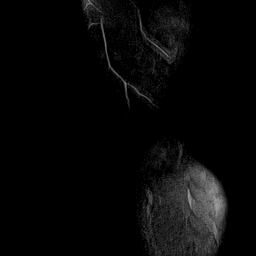
[im 6/35]
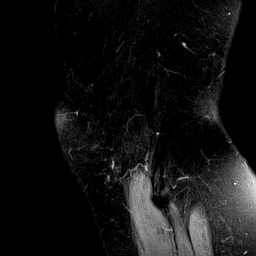
[im 12/35]
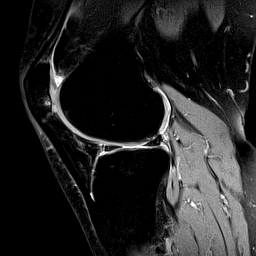
[im 18/35]
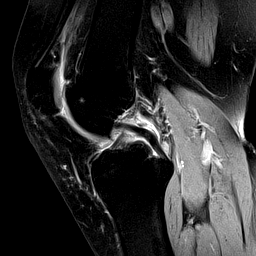
[im 23/35]
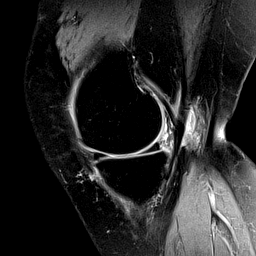
[im 29/35]
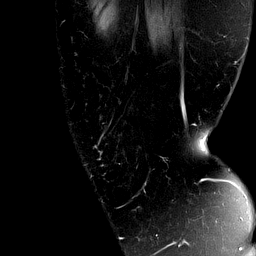
[im 35/35]
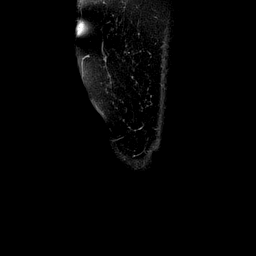

[Series 8: T2 fat-sat · sagittal · 3.0mm · 0.62mm/px · 7 of 35 slices shown (3 of 3)]
[im 1/35]
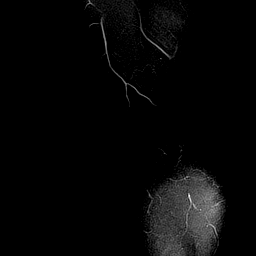
[im 6/35]
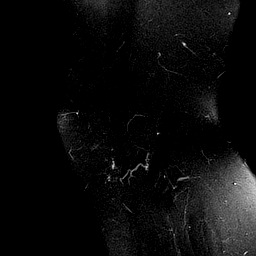
[im 12/35]
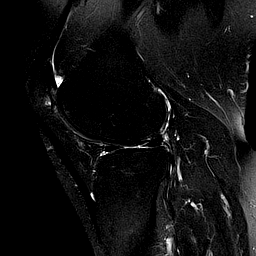
[im 18/35]
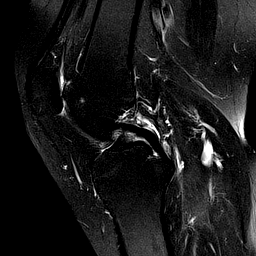
[im 23/35]
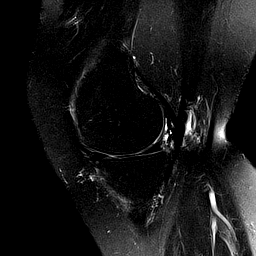
[im 29/35]
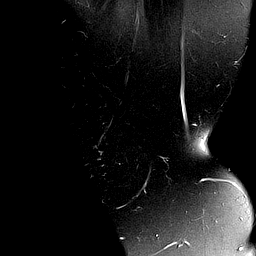
[im 35/35]
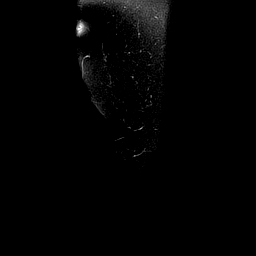

[40 of 40 positions shown; findings below may reference images not displayed]

FINDINGS: MENISCI

Medial meniscus:  Intact.

Lateral meniscus:  Intact.

LIGAMENTS

Cruciates:  Intact.

Collaterals:  Intact.

CARTILAGE

Patellofemoral: Degenerative change is seen with cartilage loss
worst along the lateral patellar facet and central femoral trochlea.

Medial:  Thinned without focal defect.

Lateral:  Mildly to moderately frayed and irregular.

Joint:  No effusion.

Popliteal Fossa:  Tiny Baker's cyst.

Extensor Mechanism:  Intact.

Bones: Osteophytosis is seen about the knee, most notable along the
lateral joint line. There is subchondral cyst formation and some
edema in the lateral patellar facet and central femoral trochlea.
There is also some subchondral edema in the anterior aspect of the
medial tibia.

Other: None.
IMPRESSION: No acute abnormality.  Negative for meniscal or ligament tear.

Tricompartmental osteoarthritis appears worst in the patellofemoral
compartment.

## 2021-10-06 ENCOUNTER — Ambulatory Visit: Payer: Federal, State, Local not specified - PPO | Admitting: Family Medicine

## 2021-10-06 ENCOUNTER — Encounter: Payer: Self-pay | Admitting: Family Medicine

## 2021-10-06 VITALS — BP 110/78 | HR 80 | Temp 97.9°F | Ht 69.0 in | Wt 216.4 lb

## 2021-10-06 DIAGNOSIS — M1712 Unilateral primary osteoarthritis, left knee: Secondary | ICD-10-CM | POA: Diagnosis not present

## 2021-10-06 MED ORDER — TRIAMCINOLONE ACETONIDE 40 MG/ML IJ SUSP
40.0000 mg | Freq: Once | INTRAMUSCULAR | Status: AC
  Administered 2021-10-06: 40 mg via INTRA_ARTICULAR

## 2021-10-06 MED ORDER — NAPROXEN 500 MG PO TABS: 500.0000 mg | ORAL_TABLET | Freq: Two times a day (BID) | ORAL | 2 refills | Status: DC

## 2021-10-06 NOTE — Addendum Note (Signed)
Addended by: Carter Kitten on: 10/06/2021 04:03 PM ? ? Modules accepted: Orders ? ?

## 2021-10-06 NOTE — Progress Notes (Signed)
? ? ?  Lexiana Spindel T. Lionel Woodberry, MD, [facility] ?Therapist, music at [facility] ?[place] ?[place], [address] ? ?Phone: [phone]  FAX: [phone] ? ?Maria Buck - 56 y.o. female  MRN [id number]  Date of Birth: 1965/12/13 ? ?Date: 10/06/2021  PCP: Pccm, Ander Gaster, MD  Referral: No ref. provider found ? ?Chief Complaint  ?Patient presents with  ? Knee Pain  ?  Wants Left injection  ? Medication Refill  ?  Would like Rx for Naproxen  ? ? ?This visit occurred during the SARS-CoV-2 public health emergency.  Safety protocols were in place, including screening questions prior to the visit, additional usage of staff PPE, and extensive cleaning of exam room while observing appropriate contact time as indicated for disinfecting solutions.  ? ?Subjective:  ? ?[name] is a 56 y.o. very pleasant female patient with Body mass index is 31.96 kg/m?. who presents with the following: ? ?L knee injection: ? ?She is here with some acute knee pain, mild flare.  Wants inj before exercising more next week. ? ?Aspiration/Injection Procedure Note ?Eulis Foster ?1966/03/05 ?Date of procedure: 10/06/2021 ? ?Procedure: Large Joint Aspiration / Injection of Knee, L ?Indications: Pain ? ?Procedure Details ?Patient verbally consented to procedure. Risks, benefits, and alternatives explained. Sterilely prepped with Chloraprep. Ethyl cholride used for anesthesia. 9 cc Lidocaine 1% mixed with 1 mL of Kenalog 40 mg injected using the anteromedial approach without difficulty. No complications with procedure and tolerated well. Patient had decreased pain post-injection. ?Medication: 1 mL of Kenalog 40 mg  ?

## 2021-11-11 ENCOUNTER — Other Ambulatory Visit: Payer: Self-pay | Admitting: Family Medicine

## 2021-11-11 DIAGNOSIS — Z1231 Encounter for screening mammogram for malignant neoplasm of breast: Secondary | ICD-10-CM

## 2021-11-12 ENCOUNTER — Other Ambulatory Visit: Payer: Self-pay

## 2021-11-12 DIAGNOSIS — D649 Anemia, unspecified: Secondary | ICD-10-CM

## 2021-11-13 ENCOUNTER — Inpatient Hospital Stay: Payer: Federal, State, Local not specified - PPO | Attending: Oncology

## 2021-11-13 ENCOUNTER — Other Ambulatory Visit: Payer: Self-pay

## 2021-11-13 ENCOUNTER — Encounter: Payer: Self-pay | Admitting: Oncology

## 2021-11-13 ENCOUNTER — Inpatient Hospital Stay: Payer: Federal, State, Local not specified - PPO | Admitting: Oncology

## 2021-11-13 VITALS — BP 119/78 | HR 70 | Temp 98.7°F | Resp 20 | Wt 208.0 lb

## 2021-11-13 DIAGNOSIS — Z8042 Family history of malignant neoplasm of prostate: Secondary | ICD-10-CM | POA: Insufficient documentation

## 2021-11-13 DIAGNOSIS — R790 Abnormal level of blood mineral: Secondary | ICD-10-CM

## 2021-11-13 DIAGNOSIS — Z8049 Family history of malignant neoplasm of other genital organs: Secondary | ICD-10-CM | POA: Diagnosis not present

## 2021-11-13 DIAGNOSIS — D649 Anemia, unspecified: Secondary | ICD-10-CM | POA: Diagnosis not present

## 2021-11-13 DIAGNOSIS — Z808 Family history of malignant neoplasm of other organs or systems: Secondary | ICD-10-CM | POA: Insufficient documentation

## 2021-11-13 DIAGNOSIS — Z809 Family history of malignant neoplasm, unspecified: Secondary | ICD-10-CM | POA: Diagnosis not present

## 2021-11-13 DIAGNOSIS — Z8041 Family history of malignant neoplasm of ovary: Secondary | ICD-10-CM | POA: Diagnosis not present

## 2021-11-13 DIAGNOSIS — Z803 Family history of malignant neoplasm of breast: Secondary | ICD-10-CM | POA: Insufficient documentation

## 2021-11-13 LAB — CBC WITH DIFFERENTIAL/PLATELET
Abs Immature Granulocytes: 0.01 10*3/uL (ref 0.00–0.07)
Basophils Absolute: 0 10*3/uL (ref 0.0–0.1)
Basophils Relative: 0 %
Eosinophils Absolute: 0.1 10*3/uL (ref 0.0–0.5)
Eosinophils Relative: 2 %
HCT: 38.3 % (ref 36.0–46.0)
Hemoglobin: 12.5 g/dL (ref 12.0–15.0)
Immature Granulocytes: 0 %
Lymphocytes Relative: 34 %
Lymphs Abs: 1.7 10*3/uL (ref 0.7–4.0)
MCH: 32.6 pg (ref 26.0–34.0)
MCHC: 32.6 g/dL (ref 30.0–36.0)
MCV: 99.7 fL (ref 80.0–100.0)
Monocytes Absolute: 0.3 10*3/uL (ref 0.1–1.0)
Monocytes Relative: 7 %
Neutro Abs: 2.7 10*3/uL (ref 1.7–7.7)
Neutrophils Relative %: 57 %
Platelets: 319 10*3/uL (ref 150–400)
RBC: 3.84 MIL/uL — ABNORMAL LOW (ref 3.87–5.11)
RDW: 11.9 % (ref 11.5–15.5)
WBC: 4.8 10*3/uL (ref 4.0–10.5)
nRBC: 0 % (ref 0.0–0.2)

## 2021-11-13 LAB — RETIC PANEL
Immature Retic Fract: 12.3 % (ref 2.3–15.9)
RBC.: 3.85 MIL/uL — ABNORMAL LOW (ref 3.87–5.11)
Retic Count, Absolute: 88.6 10*3/uL (ref 19.0–186.0)
Retic Ct Pct: 2.3 % (ref 0.4–3.1)
Reticulocyte Hemoglobin: 35.6 pg (ref >27.9)

## 2021-11-13 LAB — IRON AND TIBC
Iron: 91 ug/dL (ref 28–170)
Saturation Ratios: 28 % (ref 10.4–31.8)
TIBC: 321 ug/dL (ref 250–450)
UIBC: 230 ug/dL

## 2021-11-13 LAB — FERRITIN: Ferritin: 88 ng/mL (ref 11–307)

## 2021-11-13 NOTE — Progress Notes (Signed)
Hematology/Oncology progress note [facility] Telephone:([phone] [phone]   Patient Care Team: Pccm, Ander Gaster, MD as PCP - General (Internal Medicine)  REFERRING PROVIDER: No ref. provider found  CHIEF COMPLAINTS/REASON FOR VISIT:  Follow up for anemia  HISTORY OF PRESENTING ILLNESS:  Maria Buck is a  56 y.o.  female with PMH listed below who was referred to me for evaluation of anemia Reviewed patient's recent labs that was done.  02/03/2021 Labs revealed anemia with hemoglobin of 10.8, MCV 99   Reviewed patient's previous labs ordered by primary care physician's office, anemia is chronic onset , duration is since July 2022 No aggravating or improving factors.  Associated signs and symptoms: Patient reports fatigue.  Denies SOB with exertion.  Denies weight loss, easy bruising, hematochezia, hemoptysis, hematuria. 02/03/2021 colonoscopy - normal examination.  02/03/2021 Iron panel showed ferritin 110, iron saturation 12,  01/06/2021 B12 1550 folate 19.4  02/26/2021 EGD showed erosive gastropathy with stigmata of recent bleeding. Biopsy showed healing mucosa injury. Negative for H pylori, dysplasia, malignancy.  Dr.Vanga suggests patient to take omeprazole '40mg'$  daily  INTERVAL HISTORY Maria Buck is a 56 y.o. female who has above history reviewed by me today presents for follow up visit for anemia Patient has no new complaints. She feels well at baseline.    Review of Systems  Constitutional:  Negative for appetite change, chills, fatigue and fever.  HENT:   Negative for hearing loss and voice change.   Eyes:  Negative for eye problems.  Respiratory:  Negative for chest tightness and cough.   Cardiovascular:  Negative for chest pain.  Gastrointestinal:  Negative for abdominal distention, abdominal pain and blood in stool.  Endocrine: Negative for hot flashes.  Genitourinary:  Negative for difficulty urinating and frequency.    Musculoskeletal:  Negative for arthralgias.  Skin:  Negative for itching and rash.  Neurological:  Negative for extremity weakness.  Hematological:  Negative for adenopathy.  Psychiatric/Behavioral:  Negative for confusion.      MEDICAL HISTORY:  Past Medical History:  Diagnosis Date   Medical history non-contributory     SURGICAL HISTORY: Past Surgical History:  Procedure Laterality Date   COLONOSCOPY N/A 02/03/2021   Procedure: COLONOSCOPY;  Surgeon: Lin Landsman, MD;  Location: [facility] ENDOSCOPY;  Service: Gastroenterology;  Laterality: N/A;   COLONOSCOPY WITH PROPOFOL N/A 10/16/2019   Procedure: COLONOSCOPY WITH PROPOFOL;  Surgeon: Lin Landsman, MD;  Location: [facility] ENDOSCOPY;  Service: Gastroenterology;  Laterality: N/A;   ESOPHAGOGASTRODUODENOSCOPY (EGD) WITH PROPOFOL N/A 02/26/2021   Procedure: ESOPHAGOGASTRODUODENOSCOPY (EGD) WITH PROPOFOL;  Surgeon: Lin Landsman, MD;  Location: [facility] ENDOSCOPY;  Service: Gastroenterology;  Laterality: N/A;   NO PAST SURGERIES      SOCIAL HISTORY: Social History   Socioeconomic History   Marital status: Single    Spouse name: Not on file   Number of children: Not on file   Years of education: Not on file   Highest education level: Not on file  Occupational History   Not on file  Tobacco Use   Smoking status: Never   Smokeless tobacco: Never  Vaping Use   Vaping Use: Never used  Substance and Sexual Activity   Alcohol use: Yes    Comment: rare    Drug use: Never   Sexual activity: Yes  Other Topics Concern   Not on file  Social History Narrative   Not on file   Social Determinants of Health   Financial Resource Strain: Not on file  Food Insecurity: Not on file  Transportation Needs: Not on file  Physical Activity: Not on file  Stress: Not on file  Social Connections: Not on file  Intimate Partner Violence: Not on file    FAMILY HISTORY: Family History  Problem Relation Age of Onset   Diabetes  Father    Heart attack Father    Deep vein thrombosis Father    Kidney failure Father    Diabetes Sister    Thyroid cancer Sister    Cervical cancer Maternal Aunt    Heart attack Maternal Aunt    Ovarian cancer Maternal Aunt    Heart Problems Maternal Uncle    Prostate cancer Maternal Uncle    Diabetes Paternal Aunt    Deep vein thrombosis Paternal Uncle    Breast cancer Maternal Grandmother 48   Heart attack Maternal Grandmother    Diabetes Paternal Grandmother    Prostate cancer Paternal Grandfather    Deep vein thrombosis Cousin    Colon cancer Neg Hx     ALLERGIES:  has No Known Allergies.  MEDICATIONS:  Current Outpatient Medications  Medication Sig Dispense Refill   Cholecalciferol (VITAMIN D3 GUMMIES PO) Take by mouth. (Patient not taking: Reported on 11/13/2021)     Cyanocobalamin (VITAMIN B-12 PO) Take by mouth. (Patient not taking: Reported on 11/13/2021)     ferrous sulfate 325 (65 FE) MG tablet Take 325 mg by mouth daily with breakfast. (Patient not taking: Reported on 11/13/2021)     Multiple Vitamin (MULTIVITAMIN ADULT PO) Take by mouth. (Patient not taking: Reported on 11/13/2021)     naproxen (NAPROSYN) 500 MG tablet Take 1 tablet (500 mg total) by mouth 2 (two) times daily with a meal. (Patient not taking: Reported on 11/13/2021) 60 tablet 2   omeprazole (PRILOSEC) 40 MG capsule Take 1 capsule (40 mg total) by mouth daily before breakfast. (Patient not taking: Reported on 11/13/2021) 30 capsule 2   triamcinolone (KENALOG) 0.025 % ointment Apply 1 application topically 3 (three) times daily. Apply to affected area only. (Patient not taking: Reported on 11/13/2021) 454 g 0   TURMERIC PO Take by mouth. (Patient not taking: Reported on 11/13/2021)     No current facility-administered medications for this visit.     PHYSICAL EXAMINATION: ECOG PERFORMANCE STATUS: 0 - Asymptomatic Vitals:   11/13/21 1010  BP: 119/78  Pulse: 70  Resp: 20  Temp: 98.7 F (37.1 C)  SpO2: 100%    Filed Weights   11/13/21 1010  Weight: 208 lb (94.3 kg)    Physical Exam Constitutional:      General: She is not in acute distress. HENT:     Head: Normocephalic and atraumatic.  Eyes:     General: No scleral icterus. Cardiovascular:     Rate and Rhythm: Normal rate and regular rhythm.     Heart sounds: Normal heart sounds.  Pulmonary:     Effort: Pulmonary effort is normal. No respiratory distress.     Breath sounds: No wheezing.  Abdominal:     General: Bowel sounds are normal. There is no distension.     Palpations: Abdomen is soft.  Musculoskeletal:        General: No deformity. Normal range of motion.     Cervical back: Normal range of motion and neck supple.  Skin:    General: Skin is warm and dry.     Findings: No erythema or rash.  Neurological:     Mental Status: She is alert and oriented to  person, place, and time. Mental status is at baseline.     Cranial Nerves: No cranial nerve deficit.     Coordination: Coordination normal.  Psychiatric:        Mood and Affect: Mood normal.      LABORATORY DATA:  I have reviewed the data as listed Lab Results  Component Value Date   WBC 4.8 11/13/2021   HGB 12.5 11/13/2021   HCT 38.3 11/13/2021   MCV 99.7 11/13/2021   PLT 319 11/13/2021   Recent Labs    12/19/20 1000  NA 139  K 4.4  CL 100  CO2 21  GLUCOSE 90  BUN 10  CREATININE 0.84  CALCIUM 9.8  PROT 8.0  ALBUMIN 4.4  AST 24  ALT 15  ALKPHOS 92  BILITOT 0.4    Iron/TIBC/Ferritin/ %Sat    Component Value Date/Time   IRON 91 11/13/2021 0956   TIBC 321 11/13/2021 0956   FERRITIN 88 11/13/2021 0956   IRONPCTSAT 28 11/13/2021 0956         ASSESSMENT & PLAN:  1. Normocytic anemia   2. Family history of cancer   3. Abnormal blood level of copper     # Labs are reviewed and discussed with patient. Anemia and iron panel have both normalized.  .  # mildly elevated copper level, previously discussed with Dr.Vanga. observation. Patient  now drinks bottled water only.   Family history of cancer, recommend genetic counseling. She is interested now. Will refer.  Orders Placed This Encounter  Procedures   Ambulatory referral to Genetics    Referral Priority:   Routine    Referral Type:   Consultation    Referral Reason:   Specialty Services Required    Referred to Provider:   Faith Rogue T    Number of Visits Requested:   1    All questions were answered. The patient knows to call the clinic with any problems questions or concerns. Cc. No ref. provider found   Discharge.     Earlie Server, MD, PhD 11/13/2021

## 2021-11-13 NOTE — Progress Notes (Signed)
Patient has no concerns at the moment. 

## 2021-11-19 ENCOUNTER — Ambulatory Visit: Payer: Federal, State, Local not specified - PPO | Admitting: Family Medicine

## 2021-11-27 ENCOUNTER — Inpatient Hospital Stay (HOSPITAL_BASED_OUTPATIENT_CLINIC_OR_DEPARTMENT_OTHER): Payer: Federal, State, Local not specified - PPO | Admitting: Licensed Clinical Social Worker

## 2021-11-27 ENCOUNTER — Inpatient Hospital Stay: Payer: Federal, State, Local not specified - PPO

## 2021-11-27 DIAGNOSIS — Z8042 Family history of malignant neoplasm of prostate: Secondary | ICD-10-CM | POA: Diagnosis not present

## 2021-11-27 DIAGNOSIS — Z803 Family history of malignant neoplasm of breast: Secondary | ICD-10-CM | POA: Diagnosis not present

## 2021-11-27 DIAGNOSIS — Z8041 Family history of malignant neoplasm of ovary: Secondary | ICD-10-CM | POA: Diagnosis not present

## 2021-11-27 NOTE — Progress Notes (Signed)
REFERRING PROVIDER: Earlie Server, MD [place],  [place] [address]  PRIMARY PROVIDER:  Pccm, [facility], MD  PRIMARY REASON FOR VISIT:  1. Family history of breast cancer   2. Family history of prostate cancer   3. Family history of ovarian cancer      HISTORY OF PRESENT ILLNESS:   Maria Buck, a 56 y.o. female, was seen for a [facility] cancer genetics consultation at the request of Dr. Tasia Catchings due to a family history of cancer.  Maria Buck presents to clinic today to discuss the possibility of a hereditary predisposition to cancer, genetic testing, and to further clarify her future cancer risks, as well as potential cancer risks for family members.   CANCER HISTORY:  Maria Buck is a 56 y.o. female with no personal history of cancer.    RISK FACTORS:  Menarche was at age 19.  OCP use for approximately 0 years.  Ovaries intact: yes.  Hysterectomy: no.  Menopausal status: perimenopausal.  HRT use: 0 years. Colonoscopy: yes; normal. Mammogram within the last year: yes. Number of breast biopsies: 0. Up to date with pelvic exams: yes.  Past Medical History:  Diagnosis Date   Medical history non-contributory     Past Surgical History:  Procedure Laterality Date   COLONOSCOPY N/A 02/03/2021   Procedure: COLONOSCOPY;  Surgeon: Lin Landsman, MD;  Location: [facility] ENDOSCOPY;  Service: Gastroenterology;  Laterality: N/A;   COLONOSCOPY WITH PROPOFOL N/A 10/16/2019   Procedure: COLONOSCOPY WITH PROPOFOL;  Surgeon: Lin Landsman, MD;  Location: [facility] ENDOSCOPY;  Service: Gastroenterology;  Laterality: N/A;   ESOPHAGOGASTRODUODENOSCOPY (EGD) WITH PROPOFOL N/A 02/26/2021   Procedure: ESOPHAGOGASTRODUODENOSCOPY (EGD) WITH PROPOFOL;  Surgeon: Lin Landsman, MD;  Location: [facility] ENDOSCOPY;  Service: Gastroenterology;  Laterality: N/A;   NO PAST SURGERIES     FAMILY HISTORY:  We obtained a detailed, 4-generation family history.  Significant diagnoses are  listed below: Family History  Problem Relation Age of Onset   Diabetes Father    Heart attack Father    Deep vein thrombosis Father    Kidney failure Father    Diabetes Sister    Thyroid cancer Sister    Cervical cancer Maternal Aunt    Heart attack Maternal Aunt    Ovarian cancer Maternal Aunt    Heart Problems Maternal Uncle    Prostate cancer Maternal Uncle    Diabetes Paternal Aunt    Deep vein thrombosis Paternal Uncle    Breast cancer Maternal Grandmother 55   Heart attack Maternal Grandmother    Diabetes Paternal Grandmother    Prostate cancer Paternal Grandfather    Deep vein thrombosis Cousin    Colon cancer Neg Hx    Maria Buck has 1 full sister, 1 full brother, and paternal half sisters, no cancers.  Maria Buck's mother is living at 66. Patient has 4 maternal aunts, 2 uncles. One aunt passed of ovarian cancer in her 97s. An uncle passed of metastatic prostate cancer. Maternal grandmother had breast cancer in her 32s and passed over age 20. Grandfather passed at 29.  Maria Buck's father passed at 35. Patient had 5 paternal uncles, 1 aunt. An uncle passed of prostate cancer. Paternal grandfather also passed of prostate cancer in his 44s. Patient's first cousin once removed also passed of prostate cancer. Paternal grandmother passed at 59.  Maria Buck is unaware of previous family history of genetic testing for hereditary cancer risks. There is no reported Ashkenazi Jewish ancestry. There is no known  consanguinity.    GENETIC COUNSELING ASSESSMENT: Maria Buck is a 56 y.o. female with a family history of breast, ovarian and prostate cancer which is somewhat suggestive of a hereditary cancer syndrome and predisposition to cancer. We, therefore, discussed and recommended the following at today's visit.   DISCUSSION: We discussed that approximately 10% of  cancer is hereditary. Most cases of hereditary breast, ovarian and prostate cancer are associated with  BRCA1/BRCA2 genes, although there are other genes associated with hereditary cancer as well. Cancers and risks are gene specific. We discussed that testing is beneficial for several reasons including knowing about cancer risks, identifying potential screening and risk-reduction options that may be appropriate, and to understand if other family members could be at risk for cancer and allow them to undergo genetic testing.   We reviewed the characteristics, features and inheritance patterns of hereditary cancer syndromes. We also discussed genetic testing, including the appropriate family members to test, the process of testing, insurance coverage and turn-around-time for results. We discussed the implications of a negative, positive and/or variant of uncertain significant result. We recommended Maria Buck pursue genetic testing for the [employer] CustomNext+RNA gene panel.   The CustomNext-Cancer+RNAinsight panel offered by [facility] includes sequencing and rearrangement analysis for the following 47 genes:  APC, ATM, AXIN2, BARD1, BMPR1A, BRCA1, BRCA2, BRIP1, CDH1, CDK4, CDKN2A, CHEK2, DICER1, EPCAM, GREM1, HOXB13, MEN1, MLH1, MSH2, MSH3, MSH6, MUTYH, NBN, NF1, NF2, NTHL1, PALB2, PMS2, POLD1, POLE, PTEN, RAD51C, RAD51D, RECQL, RET, SDHA, SDHAF2, SDHB, SDHC, SDHD, SMAD4, SMARCA4, STK11, TP53, TSC1, TSC2, and VHL.  RNA data is routinely analyzed for use in variant interpretation for all genes.  Based on Maria Buck's family history of cancer, she meets medical criteria for genetic testing. Despite that she meets criteria, she may still have an out of pocket cost. We discussed that if her out of pocket cost for testing is over $100, the laboratory will call and confirm whether she wants to proceed with testing.  If the out of pocket cost of testing is less than $100 she will be billed by the genetic testing laboratory.   PLAN: After considering the risks, benefits, and limitations, Maria Buck  provided informed consent to pursue genetic testing and the blood sample was sent to [facility] for analysis of the CustomNext+RNA panel. Results should be available within approximately 2-3 weeks' time, at which point they will be disclosed by telephone to Maria Buck, as will any additional recommendations warranted by these results. Maria Buck will receive a summary of her genetic counseling visit and a copy of her results once available. This information will also be available in Epic.   Maria Buck's questions were answered to her satisfaction today. Our contact information was provided should additional questions or concerns arise. Thank you for the referral and allowing [place] to share in the care of your patient.   Faith Rogue, MS, [facility] Genetic Counselor [email or web address] Phone: [phone]  The patient was seen for a total of 25 minutes in face-to-face genetic counseling.  Dr. Grayland Ormond was available for discussion regarding this case.   _______________________________________________________________________ For Office Staff:  Number of people involved in session: 1 Was an Intern/ student involved with case: no

## 2021-12-02 ENCOUNTER — Encounter: Payer: Self-pay | Admitting: Licensed Clinical Social Worker

## 2021-12-08 ENCOUNTER — Ambulatory Visit
Admission: RE | Admit: 2021-12-08 | Discharge: 2021-12-08 | Disposition: A | Discharge status: Home / Self Care | Payer: Federal, State, Local not specified - PPO | Source: Ambulatory Visit | Attending: Family Medicine | Admitting: Family Medicine

## 2021-12-08 DIAGNOSIS — Z1231 Encounter for screening mammogram for malignant neoplasm of breast: Secondary | ICD-10-CM | POA: Insufficient documentation

## 2021-12-10 ENCOUNTER — Ambulatory Visit: Payer: Self-pay | Admitting: Licensed Clinical Social Worker

## 2021-12-10 ENCOUNTER — Telehealth: Payer: Self-pay | Admitting: Licensed Clinical Social Worker

## 2021-12-10 ENCOUNTER — Encounter: Payer: Self-pay | Admitting: Licensed Clinical Social Worker

## 2021-12-10 DIAGNOSIS — Z1379 Encounter for other screening for genetic and chromosomal anomalies: Secondary | ICD-10-CM

## 2021-12-10 NOTE — Progress Notes (Signed)
HPI:  Maria Buck was previously seen in the [facility] clinic due to a family history of breast cancer and concerns regarding a hereditary predisposition to cancer. Please refer to our prior cancer genetics clinic note for more information regarding our discussion, assessment and recommendations, at the time. Ms. Koppenhaver recent genetic test results were disclosed to her, as were recommendations warranted by these results. These results and recommendations are discussed in more detail below.  CANCER HISTORY:  Oncology History   No history exists.    FAMILY HISTORY:  We obtained a detailed, 4-generation family history.  Significant diagnoses are listed below: Family History  Problem Relation Age of Onset   Diabetes Father    Heart attack Father    Deep vein thrombosis Father    Kidney failure Father    Diabetes Sister    Thyroid cancer Sister    Breast cancer Maternal Grandmother 78   Heart attack Maternal Grandmother    Diabetes Paternal Grandmother    Prostate cancer Paternal Grandfather    Cervical cancer Maternal Aunt    Heart attack Maternal Aunt    Ovarian cancer Maternal Aunt    Heart Problems Maternal Uncle    Bone cancer Maternal Uncle    Diabetes Paternal Aunt    Deep vein thrombosis Paternal Uncle    Deep vein thrombosis Cousin    Breast cancer Cousin    Breast cancer Cousin    Cervical cancer Cousin    Breast cancer Cousin    Breast cancer Cousin    Breast cancer Other    Colon cancer Neg Hx    Ms. Schraeder has 1 full sister, 1 full brother, and paternal half sisters, no cancers.   Ms. Cacho's mother is living at 66. Patient has 4 maternal aunts, 2 uncles. One aunt passed of ovarian cancer in her 23s. An uncle passed of metastatic prostate cancer. Maternal grandmother had breast cancer in her 20s and passed over age 55. Grandfather passed at 53.   Ms. Mealor's father passed at 39. Patient had 5 paternal uncles, 1 aunt. An uncle  passed of prostate cancer. Paternal grandfather also passed of prostate cancer in his 71s. Patient's first cousin once removed also passed of prostate cancer. Paternal grandmother passed at 69.   Ms. Whittier is unaware of previous family history of genetic testing for hereditary cancer risks. There is no reported Ashkenazi Jewish ancestry. There is no known consanguinity.      GENETIC TEST RESULTS: Genetic testing reported out on 12/05/2021 through the [facility] CustomNext+RNA cancer panel found no pathogenic mutations.   The CustomNext-Cancer+RNAinsight panel offered by [facility] includes sequencing and rearrangement analysis for the following 47 genes:  APC, ATM, AXIN2, BARD1, BMPR1A, BRCA1, BRCA2, BRIP1, CDH1, CDK4, CDKN2A, CHEK2, DICER1, EPCAM, GREM1, HOXB13, MEN1, MLH1, MSH2, MSH3, MSH6, MUTYH, NBN, NF1, NF2, NTHL1, PALB2, PMS2, POLD1, POLE, PTEN, RAD51C, RAD51D, RECQL, RET, SDHA, SDHAF2, SDHB, SDHC, SDHD, SMAD4, SMARCA4, STK11, TP53, TSC1, TSC2, and VHL.  RNA data is routinely analyzed for use in variant interpretation for all genes.   The test report has been scanned into EPIC and is located under the Molecular Pathology section of the Results Review tab.  A portion of the result report is included below for reference.     We discussed that because current genetic testing is not perfect, it is possible there may be a gene mutation in one of these genes that current testing cannot detect, but that chance is small.  There  could be another gene that has not yet been discovered, or that we have not yet tested, that is responsible for the cancer diagnoses in the family. It is also possible there is a hereditary cause for the cancer in the family that Ms. Basil did not inherit and therefore was not identified in her testing.  Therefore, it is important to remain in touch with cancer genetics in the future so that we can continue to offer Ms. Beachem the most up to date genetic testing.    ADDITIONAL GENETIC TESTING: We discussed with Ms. Atiyeh that her genetic testing was fairly extensive.  If there are genes identified to increase cancer risk that can be analyzed in the future, we would be happy to discuss and coordinate this testing at that time.    CANCER SCREENING RECOMMENDATIONS: Ms. Nanninga's test result is considered negative (normal).  This means that we have not identified a hereditary cause for her family history of cancer at this time.   While reassuring, this does not definitively rule out a hereditary predisposition to cancer. It is still possible that there could be genetic mutations that are undetectable by current technology. There could be genetic mutations in genes that have not been tested or identified to increase cancer risk.  Therefore, it is recommended she continue to follow the cancer management and screening guidelines provided by her  primary healthcare provider.   An individual's cancer risk and medical management are not determined by genetic test results alone. Overall cancer risk assessment incorporates additional factors, including personal medical history, family history, and any available genetic information that may result in a personalized plan for cancer prevention and surveillance.  Based on Ms. Brinegar's personal and family history of cancer as well as her genetic test results, risk model Harriett Rush was used to estimate her risk of developing breast cancer. This estimates her lifetime risk of developing breast cancer to be approximately 18%.  The patient's lifetime breast cancer risk is a preliminary estimate based on available information using one of several models endorsed by the [employer]  (NCCN). The NCCN recommends consideration of breast MRI screening as an adjunct to mammography for patients at high risk (defined as 20% or greater lifetime risk).  This risk estimate can change over time and may be  repeated to reflect new information in her personal or family history in the future.    RECOMMENDATIONS FOR FAMILY MEMBERS:  Relatives in this family might be at some increased risk of developing cancer, over the general population risk, simply due to the family history of cancer.  We recommended female relatives in this family have a yearly mammogram beginning at age 53, or 68 years younger than the earliest onset of cancer, an annual clinical breast exam, and perform monthly breast self-exams. Female relatives in this family should also have a gynecological exam as recommended by their primary provider.  All family members should be referred for colonoscopy starting at age 13.    It is also possible there is a hereditary cause for the cancer in Ms. Roache's family that she did not inherit and therefore was not identified in her.  Based on Ms. Pattison's family history, we recommended maternal relatives have genetic counseling and testing. Ms. Gasper will let us know if we can be of any assistance in coordinating genetic counseling and/or testing for these family members.  FOLLOW-UP: Lastly, we discussed with Ms. Dahan that cancer genetics is a rapidly advancing field and it is  possible that new genetic tests will be appropriate for her and/or her family members in the future. We encouraged her to remain in contact with cancer genetics on an annual basis so we can update her personal and family histories and let her know of advances in cancer genetics that may benefit this family.   Our contact number was provided. Ms. Satchell's questions were answered to her satisfaction, and she knows she is welcome to call us at anytime with additional questions or concerns.   Faith Rogue, MS, [facility] Genetic Counselor [email or web address] Phone: [phone]

## 2021-12-10 NOTE — Telephone Encounter (Signed)
Revealed negative genetic testing.  This normal result is reassuring.  It is unlikely that there is an increased risk of cancer due to a mutation in one of these genes.  However, genetic testing is not perfect, and cannot definitively rule out a hereditary cause.  It will be important for her to keep in contact with genetics to learn if any additional testing may be needed in the future.      

## 2021-12-29 ENCOUNTER — Ambulatory Visit: Payer: Federal, State, Local not specified - PPO | Admitting: Family Medicine

## 2021-12-29 ENCOUNTER — Encounter: Payer: Self-pay | Admitting: Family Medicine

## 2021-12-29 VITALS — BP 122/78 | HR 59 | Wt 212.8 lb

## 2021-12-29 DIAGNOSIS — Z01419 Encounter for gynecological examination (general) (routine) without abnormal findings: Secondary | ICD-10-CM | POA: Diagnosis not present

## 2021-12-29 DIAGNOSIS — D649 Anemia, unspecified: Secondary | ICD-10-CM | POA: Diagnosis not present

## 2021-12-29 DIAGNOSIS — R232 Flushing: Secondary | ICD-10-CM

## 2021-12-29 LAB — CBC
Hematocrit: 34.5 % (ref 34.0–46.6)
Hemoglobin: 11.6 g/dL (ref 11.1–15.9)
MCH: 32.4 pg (ref 26.6–33.0)
MCHC: 33.6 g/dL (ref 31.5–35.7)
MCV: 96 fL (ref 79–97)
Platelets: 222 x10E3/uL (ref 150–450)
RBC: 3.58 x10E6/uL — ABNORMAL LOW (ref 3.77–5.28)
RDW: 11.4 % — ABNORMAL LOW (ref 11.7–15.4)
WBC: 6.8 x10E3/uL (ref 3.4–10.8)

## 2021-12-29 MED ORDER — VENLAFAXINE HCL ER 37.5 MG PO CP24: 37.5000 mg | ORAL_CAPSULE | Freq: Every day | ORAL | 1 refills | Status: AC

## 2021-12-29 NOTE — Assessment & Plan Note (Signed)
Repeat CBC to ensure she is maintaining a normal Hgb.

## 2021-12-29 NOTE — Assessment & Plan Note (Signed)
Options discussed including lifestyle issues. Has tried Black cohosh without relief and OTC soy estrogens. She is interested in possible anti-depressants. Usual onset of action, effectiveness on hot flashes and need for increased dosing reviewed. She will ask her cousin about her inheritable coagulopathy risk and we can test her for this, prior to any HRT usage. Will see how effective the Effexor is at that time.

## 2021-12-29 NOTE — Progress Notes (Signed)
Subjective:     Maria Buck is a 56 y.o. female and is here for a discussion of Hot flashes and night sweats.  Has friend who is on Prempro and this works for her. Not sleeping well. Has f/h of DVT in an uncle and several cousins. She is not sure what the inherited disorder is.   The following portions of the patient's history were reviewed and updated as appropriate: allergies, current medications, past family history, past medical history, past social history, past surgical history, and problem list.  Review of Systems Pertinent items noted in HPI and remainder of comprehensive ROS otherwise negative.   Objective:    BP 122/78   Pulse (!) 59   Wt 212 lb 12.8 oz (96.5 kg)   LMP 06/02/2016 (Approximate) Comment: no chance of preg  BMI 31.43 kg/m  General appearance: alert, cooperative, and appears stated age Head: Normocephalic, without obvious abnormality, atraumatic Neck: supple, symmetrical, trachea midline Lungs:  normal effort Heart: regular rate and rhythm Abdomen: soft, non-tender; bowel sounds normal; no masses,  no organomegaly Extremities: extremities normal, atraumatic, no cyanosis or edema Skin: Skin color, texture, turgor normal. No rashes or lesions Neurologic: Grossly normal    Assessment:    Healthy female exam.      Plan:   Problem List Items Addressed This Visit       Unprioritized   Hot flashes - Primary    Options discussed including lifestyle issues. Has tried Black cohosh without relief and OTC soy estrogens. She is interested in possible anti-depressants. Usual onset of action, effectiveness on hot flashes and need for increased dosing reviewed. She will ask her cousin about her inheritable coagulopathy risk and we can test her for this, prior to any HRT usage. Will see how effective the Effexor is at that time.       Relevant Medications   venlafaxine XR (EFFEXOR XR) 37.5 MG 24 hr capsule   Normocytic anemia    Repeat CBC to ensure she is  maintaining a normal Hgb.      Relevant Orders   CBC   Return in 6 weeks (on 02/09/2022) for a follow-up.     See After Visit Summary for Counseling Recommendations

## 2022-01-18 ENCOUNTER — Other Ambulatory Visit: Payer: Self-pay | Admitting: Family Medicine

## 2022-07-08 ENCOUNTER — Ambulatory Visit
Admission: EM | Admit: 2022-07-08 | Discharge: 2022-07-08 | Disposition: A | Discharge status: Home / Self Care | Payer: Federal, State, Local not specified - PPO | Attending: Emergency Medicine | Admitting: Emergency Medicine

## 2022-07-08 DIAGNOSIS — J029 Acute pharyngitis, unspecified: Secondary | ICD-10-CM

## 2022-07-08 LAB — POCT RAPID STREP A (OFFICE): Rapid Strep A Screen: NEGATIVE

## 2022-07-08 NOTE — ED Triage Notes (Signed)
Patient to Urgent Care with complaints of sore throat x2 days. Denies any known fevers.   Reports being exposed to someone who might have strep throat.  Has been taking mucinex/ cough drops.

## 2022-07-08 NOTE — ED Provider Notes (Signed)
Roderic Palau    CSN: [id number] Arrival date & time: 07/08/22  1739      History   Chief Complaint Chief Complaint  Patient presents with   Sore Throat    HPI Maria Buck is a 58 y.o. female.  Patient presents with 2 day history of sore throat.  She is concerned for possible exposure to strep.  No fever, rash, cough, shortness of breath, or other symptoms.  Treatment at home with Mucinex.  Her medical history includes vertigo, anxiety, anemia.   The history is provided by the patient and medical records.    Past Medical History:  Diagnosis Date   Medical history non-contributory     Patient Active Problem List   Diagnosis Date Noted   Genetic testing 12/10/2021   Normocytic anemia 02/19/2021   Thrombocytopenia ([place]) 12/25/2020   Colon cancer screening    Hot flashes 01/17/2019   Chest pain with low risk for cardiac etiology 07/28/2018   Anxiety state 04/21/2016   Vertigo 04/21/2016   Megaloblastic anemia 04/21/2016    Past Surgical History:  Procedure Laterality Date   COLONOSCOPY N/A 02/03/2021   Procedure: COLONOSCOPY;  Surgeon: Lin Landsman, MD;  Location: [facility];  Service: Gastroenterology;  Laterality: N/A;   COLONOSCOPY WITH PROPOFOL N/A 10/16/2019   Procedure: COLONOSCOPY WITH PROPOFOL;  Surgeon: Lin Landsman, MD;  Location: [facility] ENDOSCOPY;  Service: Gastroenterology;  Laterality: N/A;   ESOPHAGOGASTRODUODENOSCOPY (EGD) WITH PROPOFOL N/A 02/26/2021   Procedure: ESOPHAGOGASTRODUODENOSCOPY (EGD) WITH PROPOFOL;  Surgeon: Lin Landsman, MD;  Location: [facility] ENDOSCOPY;  Service: Gastroenterology;  Laterality: N/A;   NO PAST SURGERIES      OB History     Gravida  0   Para  0   Term  0   Preterm  0   AB  0   Living  0      SAB  0   IAB  0   Ectopic  0   Multiple  0   Live Births  0            Home Medications    Prior to Admission medications   Medication Sig Start Date End Date Taking?  Authorizing Provider  Cholecalciferol (VITAMIN D3 GUMMIES PO) Take by mouth. Patient not taking: Reported on 11/13/2021    [provider]  Cyanocobalamin (VITAMIN B-12 PO) Take by mouth. Patient not taking: Reported on 11/13/2021    [provider]  ferrous sulfate 325 (65 FE) MG tablet Take 325 mg by mouth daily with breakfast. Patient not taking: Reported on 11/13/2021    [provider]  Multiple Vitamin (MULTIVITAMIN ADULT PO) Take by mouth. Patient not taking: Reported on 11/13/2021    [provider]  naproxen (NAPROSYN) 500 MG tablet TAKE 1 TABLET BY MOUTH TWICE DAILY WITH A MEAL 01/19/22   Donnamae Jude, MD  omeprazole (PRILOSEC) 40 MG capsule Take 1 capsule (40 mg total) by mouth daily before breakfast. Patient not taking: Reported on 11/13/2021 02/26/21   Lin Landsman, MD  triamcinolone (KENALOG) 0.025 % ointment Apply 1 application topically 3 (three) times daily. Apply to affected area only. Patient not taking: Reported on 11/13/2021 03/01/21   Scot Jun, NP  TURMERIC PO Take by mouth.    [provider]  venlafaxine XR (EFFEXOR XR) 37.5 MG 24 hr capsule Take 1 capsule (37.5 mg total) by mouth daily with breakfast. 12/29/21   Donnamae Jude, MD    Family History  Family History  Problem Relation Age of Onset   Diabetes Father    Heart attack Father    Kidney failure Father    Diabetes Sister    Thyroid cancer Sister    Cervical cancer Maternal Aunt    Heart attack Maternal Aunt    Ovarian cancer Maternal Aunt    Heart Problems Maternal Uncle    Bone cancer Maternal Uncle    Diabetes Paternal Aunt    Deep vein thrombosis Paternal Uncle    Breast cancer Maternal Grandmother 35   Heart attack Maternal Grandmother    Diabetes Paternal Grandmother    Prostate cancer Paternal Grandfather    Deep vein thrombosis Cousin    Breast cancer Cousin    Breast cancer Cousin    Cervical cancer Cousin    Breast cancer Cousin    Breast  cancer Cousin    Breast cancer Other    Colon cancer Neg Hx     Social History Social History   Tobacco Use   Smoking status: Never   Smokeless tobacco: Never  Vaping Use   Vaping Use: Never used  Substance Use Topics   Alcohol use: Yes    Comment: rare    Drug use: Never     Allergies   Patient has no known allergies.   Review of Systems Review of Systems  Constitutional:  Negative for chills and fever.  HENT:  Positive for sore throat. Negative for ear pain.   Respiratory:  Negative for cough and shortness of breath.   Cardiovascular:  Negative for chest pain and palpitations.  Gastrointestinal:  Negative for diarrhea and vomiting.  Skin:  Negative for color change and rash.  All other systems reviewed and are negative.    Physical Exam Triage Vital Signs ED Triage Vitals  Enc Vitals Group     BP 07/08/22 1816 126/84     Pulse Rate 07/08/22 1756 (!) 106     Resp 07/08/22 1756 18     Temp 07/08/22 1756 98 F (36.7 C)     Temp src --      SpO2 07/08/22 1756 96 %     Weight --      Height --      Head Circumference --      Peak Flow --      Pain Score 07/08/22 1812 4     Pain Loc --      Pain Edu? --      Excl. in [place]? --    No data found.  Updated Vital Signs BP 126/84   Pulse (!) 106   Temp 98 F (36.7 C)   Resp 18   LMP 06/02/2016 (Approximate) Comment: no chance of preg  SpO2 96%   Visual Acuity Right Eye Distance:   Left Eye Distance:   Bilateral Distance:    Right Eye Near:   Left Eye Near:    Bilateral Near:     Physical Exam Vitals and nursing note reviewed.  Constitutional:      General: She is not in acute distress.    Appearance: She is well-developed. She is not ill-appearing.  HENT:     Right Ear: Tympanic membrane normal.     Left Ear: Tympanic membrane normal.     Nose: Nose normal.     Mouth/Throat:     Mouth: Mucous membranes are moist.     Pharynx: Oropharynx is clear.     Comments: Clear PND.  Cardiovascular:  Rate and Rhythm: Normal rate and regular rhythm.     Heart sounds: Normal heart sounds.  Pulmonary:     Effort: Pulmonary effort is normal. No respiratory distress.     Breath sounds: Normal breath sounds.  Musculoskeletal:     Cervical back: Neck supple.  Skin:    General: Skin is warm and dry.  Neurological:     Mental Status: She is alert.  Psychiatric:        Mood and Affect: Mood normal.        Behavior: Behavior normal.      UC Treatments / Results  Labs (all labs ordered are listed, but only abnormal results are displayed) Labs Reviewed  POCT RAPID STREP A (OFFICE)    EKG   Radiology No results found.  Procedures Procedures (including critical care time)  Medications Ordered in UC Medications - No data to display  Initial Impression / Assessment and Plan / UC Course  I have reviewed the triage vital signs and the nursing notes.  Pertinent labs & imaging results that were available during my care of the patient were reviewed by me and considered in my medical decision making (see chart for details).    Viral pharyngitis.  Rapid strep negative.  Patient declines COVID test.  Discussed symptomatic treatment including Tylenol or ibuprofen.  Antibiotic stewardship discussed.  Instructed patient to follow up with her PCP if symptoms are not improving.  Patient left without discharge paperwork.   Final Clinical Impressions(s) / UC Diagnoses   Final diagnoses:  Viral pharyngitis     Discharge Instructions      Your strep test is negative.  Follow up with your primary care provider if your symptoms are not improving.        ED Prescriptions   None    PDMP not reviewed this encounter.   Sharion Balloon, NP 07/08/22 [phone]

## 2022-07-08 NOTE — Discharge Instructions (Addendum)
Your strep test is negative.  Follow up with your primary care provider if your symptoms are not improving.

## 2022-08-24 ENCOUNTER — Ambulatory Visit
Admission: EM | Admit: 2022-08-24 | Discharge: 2022-08-24 | Disposition: A | Discharge status: Home / Self Care | Payer: Federal, State, Local not specified - PPO

## 2022-08-24 DIAGNOSIS — H1031 Unspecified acute conjunctivitis, right eye: Secondary | ICD-10-CM | POA: Diagnosis not present

## 2022-08-24 MED ORDER — POLYMYXIN B-TRIMETHOPRIM 10000-0.1 UNIT/ML-% OP SOLN: 1.0000 [drp] | Freq: Four times a day (QID) | OPHTHALMIC | 0 refills | Status: AC

## 2022-08-24 NOTE — ED Provider Notes (Signed)
Roderic Palau    CSN: [id number] Arrival date & time: 08/24/22  1159      History   Chief Complaint Chief Complaint  Patient presents with   Eye Problem    HPI Maria Buck is a 57 y.o. female.  Patient presents with right eye redness, yellow-green drainage, crusting in her lashes this morning.  No eye injury, eye pain, change in vision, fever, chills, or other symptoms.  No treatments at home.  Her medical history includes vertigo, anemia, anxiety.  The history is provided by the patient and medical records.    Past Medical History:  Diagnosis Date   Medical history non-contributory     Patient Active Problem List   Diagnosis Date Noted   Genetic testing 12/10/2021   Normocytic anemia 02/19/2021   Thrombocytopenia ([place]) 12/25/2020   Colon cancer screening    Hot flashes 01/17/2019   Chest pain with low risk for cardiac etiology 07/28/2018   Anxiety state 04/21/2016   Vertigo 04/21/2016   Megaloblastic anemia 04/21/2016    Past Surgical History:  Procedure Laterality Date   COLONOSCOPY N/A 02/03/2021   Procedure: COLONOSCOPY;  Surgeon: Lin Landsman, MD;  Location: [facility];  Service: Gastroenterology;  Laterality: N/A;   COLONOSCOPY WITH PROPOFOL N/A 10/16/2019   Procedure: COLONOSCOPY WITH PROPOFOL;  Surgeon: Lin Landsman, MD;  Location: [facility] ENDOSCOPY;  Service: Gastroenterology;  Laterality: N/A;   ESOPHAGOGASTRODUODENOSCOPY (EGD) WITH PROPOFOL N/A 02/26/2021   Procedure: ESOPHAGOGASTRODUODENOSCOPY (EGD) WITH PROPOFOL;  Surgeon: Lin Landsman, MD;  Location: [facility] ENDOSCOPY;  Service: Gastroenterology;  Laterality: N/A;   NO PAST SURGERIES      OB History     Gravida  0   Para  0   Term  0   Preterm  0   AB  0   Living  0      SAB  0   IAB  0   Ectopic  0   Multiple  0   Live Births  0            Home Medications    Prior to Admission medications   Medication Sig Start Date End Date Taking?  Authorizing Provider  meclizine (ANTIVERT) 25 MG tablet Take 25 mg by mouth 3 (three) times daily as needed for dizziness.   Yes [provider]  trimethoprim-polymyxin b (POLYTRIM) ophthalmic solution Place 1 drop into both eyes 4 (four) times daily for 7 days. 08/24/22 08/31/22 Yes Sharion Balloon, NP  Cholecalciferol (VITAMIN D3 GUMMIES PO) Take by mouth. Patient not taking: Reported on 11/13/2021    [provider]  Cyanocobalamin (VITAMIN B-12 PO) Take by mouth. Patient not taking: Reported on 11/13/2021    [provider]  ferrous sulfate 325 (65 FE) MG tablet Take 325 mg by mouth daily with breakfast. Patient not taking: Reported on 11/13/2021    [provider]  Multiple Vitamin (MULTIVITAMIN ADULT PO) Take by mouth. Patient not taking: Reported on 11/13/2021    [provider]  naproxen (NAPROSYN) 500 MG tablet TAKE 1 TABLET BY MOUTH TWICE DAILY WITH A MEAL 01/19/22   Donnamae Jude, MD  omeprazole (PRILOSEC) 40 MG capsule Take 1 capsule (40 mg total) by mouth daily before breakfast. Patient not taking: Reported on 11/13/2021 02/26/21   Lin Landsman, MD  triamcinolone (KENALOG) 0.025 % ointment Apply 1 application topically 3 (three) times daily. Apply to affected area only. Patient not taking: Reported on 11/13/2021 03/01/21   Kenton Kingfisher,  Carroll Sage, NP  TURMERIC PO Take by mouth.    [provider]  venlafaxine XR (EFFEXOR XR) 37.5 MG 24 hr capsule Take 1 capsule (37.5 mg total) by mouth daily with breakfast. Patient not taking: Reported on 08/24/2022 12/29/21   Donnamae Jude, MD    Family History Family History  Problem Relation Age of Onset   Diabetes Father    Heart attack Father    Kidney failure Father    Diabetes Sister    Thyroid cancer Sister    Cervical cancer Maternal Aunt    Heart attack Maternal Aunt    Ovarian cancer Maternal Aunt    Heart Problems Maternal Uncle    Bone cancer Maternal Uncle    Diabetes Paternal Aunt     Deep vein thrombosis Paternal Uncle    Breast cancer Maternal Grandmother 22   Heart attack Maternal Grandmother    Diabetes Paternal Grandmother    Prostate cancer Paternal Grandfather    Deep vein thrombosis Cousin    Breast cancer Cousin    Breast cancer Cousin    Cervical cancer Cousin    Breast cancer Cousin    Breast cancer Cousin    Breast cancer Other    Colon cancer Neg Hx     Social History Social History   Tobacco Use   Smoking status: Never   Smokeless tobacco: Never  Vaping Use   Vaping Use: Never used  Substance Use Topics   Alcohol use: Yes    Comment: rare    Drug use: Never     Allergies   Patient has no known allergies.   Review of Systems Review of Systems  Constitutional:  Negative for chills and fever.  HENT:  Negative for ear pain and sore throat.   Eyes:  Positive for discharge and redness. Negative for pain and visual disturbance.  Respiratory:  Negative for cough and shortness of breath.   Skin:  Negative for color change and rash.  All other systems reviewed and are negative.    Physical Exam Triage Vital Signs ED Triage Vitals  Enc Vitals Group     BP 08/24/22 1256 132/83     Pulse Rate 08/24/22 1242 76     Resp 08/24/22 1242 18     Temp 08/24/22 1242 98.3 F (36.8 C)     Temp src --      SpO2 08/24/22 1242 98 %     Weight 08/24/22 1253 215 lb (97.5 kg)     Height 08/24/22 1253 5\' 9"  (1.753 m)     Head Circumference --      Peak Flow --      Pain Score 08/24/22 1253 0     Pain Loc --      Pain Edu? --      Excl. in [place]? --    No data found.  Updated Vital Signs BP 132/83   Pulse 76   Temp 98.3 F (36.8 C)   Resp 18   Ht 5\' 9"  (1.753 m)   Wt 215 lb (97.5 kg)   LMP 06/02/2016 (Approximate) Comment: no chance of preg  SpO2 98%   BMI 31.75 kg/m   Visual Acuity Right Eye Distance:   Left Eye Distance:   Bilateral Distance:    Right Eye Near:   Left Eye Near:    Bilateral Near:     Physical Exam Vitals and  nursing note reviewed.  Constitutional:      General: She is  not in acute distress.    Appearance: She is well-developed. She is not ill-appearing.  HENT:     Right Ear: Tympanic membrane normal.     Left Ear: Tympanic membrane normal.     Nose: Nose normal.     Mouth/Throat:     Mouth: Mucous membranes are moist.     Pharynx: Oropharynx is clear.  Eyes:     General: Lids are normal. Vision grossly intact.     Extraocular Movements: Extraocular movements intact.     Conjunctiva/sclera:     Right eye: Right conjunctiva is injected.     Pupils: Pupils are equal, round, and reactive to light.  Cardiovascular:     Rate and Rhythm: Normal rate and regular rhythm.     Heart sounds: Normal heart sounds.  Pulmonary:     Effort: Pulmonary effort is normal. No respiratory distress.     Breath sounds: Normal breath sounds.  Musculoskeletal:     Cervical back: Neck supple.  Skin:    General: Skin is warm and dry.  Neurological:     Mental Status: She is alert.  Psychiatric:        Mood and Affect: Mood normal.        Behavior: Behavior normal.      UC Treatments / Results  Labs (all labs ordered are listed, but only abnormal results are displayed) Labs Reviewed - No data to display  EKG   Radiology No results found.  Procedures Procedures (including critical care time)  Medications Ordered in UC Medications - No data to display  Initial Impression / Assessment and Plan / UC Course  I have reviewed the triage vital signs and the nursing notes.  Pertinent labs & imaging results that were available during my care of the patient were reviewed by me and considered in my medical decision making (see chart for details).    Right eye bacterial conjunctivitis.  Treating with Polytrim eyedrops.  Education provided on conjunctivitis.  Instructed patient to follow-up with her PCP if her symptoms are not improving.  ED precautions discussed.  Patient agrees to plan of care.    Final Clinical Impressions(s) / UC Diagnoses   Final diagnoses:  Acute bacterial conjunctivitis of right eye     Discharge Instructions      Use the antibiotic eyedrops as prescribed.    Follow-up with your primary care provider if your symptoms are not improving.        ED Prescriptions     Medication Sig Dispense Auth. Provider   trimethoprim-polymyxin b (POLYTRIM) ophthalmic solution Place 1 drop into both eyes 4 (four) times daily for 7 days. 10 mL Sharion Balloon, NP      PDMP not reviewed this encounter.   Sharion Balloon, NP 08/24/22 1316

## 2022-08-24 NOTE — Discharge Instructions (Addendum)
Use the antibiotic eyedrops as prescribed.  Follow up with your primary care provider if your symptoms are not improving.   ? ? ?

## 2022-08-24 NOTE — ED Triage Notes (Signed)
Patient to Urgent Care with complaints of right sided eye drainage/ redness that started today. Reports concerns about possible conjunctivitis.

## 2023-01-25 ENCOUNTER — Other Ambulatory Visit: Payer: Self-pay | Admitting: Family Medicine

## 2023-01-25 DIAGNOSIS — Z1231 Encounter for screening mammogram for malignant neoplasm of breast: Secondary | ICD-10-CM

## 2023-02-01 ENCOUNTER — Ambulatory Visit
Admission: EM | Admit: 2023-02-01 | Discharge: 2023-02-01 | Disposition: A | Discharge status: Home / Self Care | Payer: Federal, State, Local not specified - PPO | Attending: Emergency Medicine | Admitting: Emergency Medicine

## 2023-02-01 DIAGNOSIS — R21 Rash and other nonspecific skin eruption: Secondary | ICD-10-CM

## 2023-02-01 MED ORDER — TRIAMCINOLONE ACETONIDE 0.025 % EX OINT: 1.0000 | TOPICAL_OINTMENT | Freq: Three times a day (TID) | CUTANEOUS | 1 refills | Status: AC

## 2023-02-01 NOTE — Discharge Instructions (Signed)
Presentation and the rash is most consistent with an insect bite as there is small pinpoints within each red spot  At this time there are no current signs of infection such as drainage, fevers  You may apply triamcinolone cream twice daily over the affected areas until cleared  Avoid Long exposure to heat such as when in the steamy showers and when outside as this may cause irritation to the skin  If you have any concerns regarding healing you may follow-up with his urgent care as needed

## 2023-02-01 NOTE — ED Provider Notes (Signed)
[name]    CSN: [id number] Arrival date & time: 02/01/23  1220      History   Chief Complaint Chief Complaint  Patient presents with   Rash    HPI Maria Buck is a 57 y.o. female.   Patient presents for evaluation of erythematous rash present to the face and the bilateral upper extremities beginning 2 days ago upon return from the beach.  Believes to be insect bites as she witnessed this occur to the arms but does not recall bites occurring to the face.  Mildly pruritic.  Has not attempted treatment.  Has not occurred in the past.  No other close contacts who she completed travel with has similar symptoms.  Denies drainage or fevers.  Denies changes in toiletries, diet.  Past Medical History:  Diagnosis Date   Medical history non-contributory     Patient Active Problem List   Diagnosis Date Noted   Genetic testing 12/10/2021   Normocytic anemia 02/19/2021   Thrombocytopenia (HCC) 12/25/2020   Colon cancer screening    Hot flashes 01/17/2019   Chest pain with low risk for cardiac etiology 07/28/2018   Anxiety state 04/21/2016   Vertigo 04/21/2016   Megaloblastic anemia 04/21/2016    Past Surgical History:  Procedure Laterality Date   COLONOSCOPY N/A 02/03/2021   Procedure: COLONOSCOPY;  Surgeon: Toney Reil, MD;  Location: [facility] ENDOSCOPY;  Service: Gastroenterology;  Laterality: N/A;   COLONOSCOPY WITH PROPOFOL N/A 10/16/2019   Procedure: COLONOSCOPY WITH PROPOFOL;  Surgeon: Toney Reil, MD;  Location: [facility] ENDOSCOPY;  Service: Gastroenterology;  Laterality: N/A;   ESOPHAGOGASTRODUODENOSCOPY (EGD) WITH PROPOFOL N/A 02/26/2021   Procedure: ESOPHAGOGASTRODUODENOSCOPY (EGD) WITH PROPOFOL;  Surgeon: Toney Reil, MD;  Location: [facility] ENDOSCOPY;  Service: Gastroenterology;  Laterality: N/A;   NO PAST SURGERIES      OB History     Gravida  0   Para  0   Term  0   Preterm  0   AB  0   Living  0      SAB  0   IAB  0    Ectopic  0   Multiple  0   Live Births  0            Home Medications    Prior to Admission medications   Medication Sig Start Date End Date Taking? Authorizing Provider  Cholecalciferol (VITAMIN D3 GUMMIES PO) Take by mouth. Patient not taking: Reported on 11/13/2021    [provider]  Cyanocobalamin (VITAMIN B-12 PO) Take by mouth. Patient not taking: Reported on 11/13/2021    [provider]  ferrous sulfate 325 (65 FE) MG tablet Take 325 mg by mouth daily with breakfast. Patient not taking: Reported on 11/13/2021    [provider]  meclizine (ANTIVERT) 25 MG tablet Take 25 mg by mouth 3 (three) times daily as needed for dizziness.    [provider]  Multiple Vitamin (MULTIVITAMIN ADULT PO) Take by mouth. Patient not taking: Reported on 11/13/2021    [provider]  naproxen (NAPROSYN) 500 MG tablet TAKE 1 TABLET BY MOUTH TWICE DAILY WITH A MEAL 01/19/22   Reva Bores, MD  omeprazole (PRILOSEC) 40 MG capsule Take 1 capsule (40 mg total) by mouth daily before breakfast. Patient not taking: Reported on 11/13/2021 02/26/21   Toney Reil, MD  triamcinolone (KENALOG) 0.025 % ointment Apply 1 Application topically 3 (three) times daily. Apply to affected area only. 02/01/23  Brandie Lopes, Elita Boone, NP  TURMERIC PO Take by mouth.    [provider]  venlafaxine XR (EFFEXOR XR) 37.5 MG 24 hr capsule Take 1 capsule (37.5 mg total) by mouth daily with breakfast. Patient not taking: Reported on 08/24/2022 12/29/21   Reva Bores, MD    Family History Family History  Problem Relation Age of Onset   Diabetes Father    Heart attack Father    Kidney failure Father    Diabetes Sister    Thyroid cancer Sister    Cervical cancer Maternal Aunt    Heart attack Maternal Aunt    Ovarian cancer Maternal Aunt    Heart Problems Maternal Uncle    Bone cancer Maternal Uncle    Diabetes Paternal Aunt    Deep vein thrombosis Paternal Uncle     Breast cancer Maternal Grandmother 40   Heart attack Maternal Grandmother    Diabetes Paternal Grandmother    Prostate cancer Paternal Grandfather    Deep vein thrombosis Cousin    Breast cancer Cousin    Breast cancer Cousin    Cervical cancer Cousin    Breast cancer Cousin    Breast cancer Cousin    Breast cancer Other    Colon cancer Neg Hx     Social History Social History   Tobacco Use   Smoking status: Never   Smokeless tobacco: Never  Vaping Use   Vaping status: Never Used  Substance Use Topics   Alcohol use: Yes    Comment: rare    Drug use: Never     Allergies   Patient has no known allergies.   Review of Systems Review of Systems  Constitutional: Negative.   HENT: Negative.    Respiratory: Negative.    Cardiovascular: Negative.   Skin:  Positive for rash. Negative for color change, pallor and wound.     Physical Exam Triage Vital Signs ED Triage Vitals  Encounter Vitals Group     BP 02/01/23 1313 132/84     Systolic BP Percentile --      Diastolic BP Percentile --      Pulse Rate 02/01/23 1313 72     Resp 02/01/23 1313 17     Temp 02/01/23 1313 98.8 F (37.1 C)     Temp src --      SpO2 02/01/23 1313 96 %     Weight 02/01/23 1313 220 lb (99.8 kg)     Height 02/01/23 1313 5\' 9"  (1.753 m)     Head Circumference --      Peak Flow --      Pain Score 02/01/23 1310 0     Pain Loc --      Pain Education --      Exclude from Growth Chart --    No data found.  Updated Vital Signs BP 132/84   Pulse 72   Temp 98.8 F (37.1 C)   Resp 17   Ht 5\' 9"  (1.753 m)   Wt 220 lb (99.8 kg)   LMP 06/02/2016 (Approximate) Comment: no chance of preg  SpO2 96%   BMI 32.49 kg/m   Visual Acuity Right Eye Distance:   Left Eye Distance:   Bilateral Distance:    Right Eye Near:   Left Eye Near:    Bilateral Near:     Physical Exam Constitutional:      Appearance: Normal appearance.  Eyes:     Extraocular Movements: Extraocular movements intact.   Pulmonary:  Effort: Pulmonary effort is normal.  Musculoskeletal:     Comments: Erythematous papules present to the forehead and to the bilateral upper extremities, small pinpoint markings to the center, no drainage noted, nontender  Neurological:     Mental Status: She is alert and oriented to person, place, and time. Mental status is at baseline.      UC Treatments / Results  Labs (all labs ordered are listed, but only abnormal results are displayed) Labs Reviewed - No data to display  EKG   Radiology No results found.  Procedures Procedures (including critical care time)  Medications Ordered in UC Medications - No data to display  Initial Impression / Assessment and Plan / UC Course  I have reviewed the triage vital signs and the nursing notes.  Pertinent labs & imaging results that were available during my care of the patient were reviewed by me and considered in my medical decision making (see chart for details).  Rash  Presentation consistent with insect bites, localized reactions, no signs of infection noted, prescribed Kenalog cream for treatment, May follow-up for any concerns regarding healing Final Clinical Impressions(s) / UC Diagnoses   Final diagnoses:  Rash     Discharge Instructions      Presentation and the rash is most consistent with an insect bite as there is small pinpoints within each red spot  At this time there are no current signs of infection such as drainage, fevers  You may apply triamcinolone cream twice daily over the affected areas until cleared  Avoid Long exposure to heat such as when in the steamy showers and when outside as this may cause irritation to the skin  If you have any concerns regarding healing you may follow-up with his urgent care as needed   ED Prescriptions     Medication Sig Dispense Auth. Provider   triamcinolone (KENALOG) 0.025 % ointment Apply 1 Application topically 3 (three) times daily. Apply to  affected area only. 80 g Valinda Hoar, NP      PDMP not reviewed this encounter.   Valinda Hoar, NP 02/01/23 1413

## 2023-02-01 NOTE — ED Triage Notes (Signed)
Patient to Urgent Care with complaints of "bumps" present to face and arm. Areas are itchy, red, and raised.   Symptoms started two days ago. No home interventions attempted.

## 2023-02-11 ENCOUNTER — Ambulatory Visit
Admission: RE | Admit: 2023-02-11 | Discharge: 2023-02-11 | Disposition: A | Discharge status: Home / Self Care | Payer: Federal, State, Local not specified - PPO | Source: Ambulatory Visit | Attending: Family Medicine | Admitting: Family Medicine

## 2023-02-11 DIAGNOSIS — Z1231 Encounter for screening mammogram for malignant neoplasm of breast: Secondary | ICD-10-CM | POA: Diagnosis not present

## 2023-03-13 ENCOUNTER — Emergency Department
Admission: EM | Admit: 2023-03-13 | Discharge: 2023-03-13 | Disposition: A | Discharge status: Home / Self Care | Payer: Federal, State, Local not specified - PPO | Attending: Emergency Medicine | Admitting: Emergency Medicine

## 2023-03-13 ENCOUNTER — Other Ambulatory Visit: Payer: Self-pay

## 2023-03-13 ENCOUNTER — Emergency Department: Payer: Federal, State, Local not specified - PPO

## 2023-03-13 DIAGNOSIS — Y92009 Unspecified place in unspecified non-institutional (private) residence as the place of occurrence of the external cause: Secondary | ICD-10-CM | POA: Diagnosis not present

## 2023-03-13 DIAGNOSIS — W01198A Fall on same level from slipping, tripping and stumbling with subsequent striking against other object, initial encounter: Secondary | ICD-10-CM | POA: Diagnosis not present

## 2023-03-13 DIAGNOSIS — S161XXA Strain of muscle, fascia and tendon at neck level, initial encounter: Secondary | ICD-10-CM | POA: Diagnosis not present

## 2023-03-13 DIAGNOSIS — S0990XA Unspecified injury of head, initial encounter: Secondary | ICD-10-CM | POA: Insufficient documentation

## 2023-03-13 MED ORDER — IBUPROFEN 800 MG PO TABS
800.0000 mg | ORAL_TABLET | Freq: Once | ORAL | Status: AC
  Administered 2023-03-13: 800 mg via ORAL
  Filled 2023-03-13: qty 1

## 2023-03-13 MED ORDER — IBUPROFEN 800 MG PO TABS: 800.0000 mg | ORAL_TABLET | Freq: Three times a day (TID) | ORAL | 0 refills | Status: AC | PRN

## 2023-03-13 NOTE — Discharge Instructions (Addendum)
Your exam and CT scans normal at this time.  No signs of a serious head or neck injury related to your fall.  Take OTC Tylenol along with the prescription ibuprofen for pain relief.  You may apply ice to help reduce inflammation.

## 2023-03-13 NOTE — ED Provider Notes (Signed)
[facility] Emergency Department Provider Note     Event Date/Time   First MD Initiated Contact with Patient 03/13/23 1741     (approximate)   History   Fall   HPI  Maria Buck is a 57 y.o. female with a noncontributory medical history, presents to the ED following mechanical fall this morning.  She presents of a fall with reported posterior head contusion.  Patient denies any LOC or blood thinner use.  No reports of any nausea, vomiting, dizziness, weakness reported.  No other injury noted other than posterior head pain.  Physical Exam   Triage Vital Signs: ED Triage Vitals  Encounter Vitals Group     BP 03/13/23 1643 139/83     Systolic BP Percentile --      Diastolic BP Percentile --      Pulse Rate 03/13/23 1643 79     Resp 03/13/23 1643 16     Temp 03/13/23 1643 98.4 F (36.9 C)     Temp Source 03/13/23 1643 Oral     SpO2 03/13/23 1643 95 %     Weight 03/13/23 1644 210 lb (95.3 kg)     Height 03/13/23 1644 5\' 9"  (1.753 m)     Head Circumference --      Peak Flow --      Pain Score 03/13/23 1648 5     Pain Loc --      Pain Education --      Exclude from Growth Chart --     Most recent vital signs: Vitals:   03/13/23 1643 03/13/23 1922  BP: 139/83 (!) 143/85  Pulse: 79 77  Resp: 16 16  Temp: 98.4 F (36.9 C)   SpO2: 95% 97%    General Awake, no distress. NAD HEENT NCAT. PERRL. EOMI. No rhinorrhea. Mucous membranes are moist.  CV:  Good peripheral perfusion.  RESP:  Normal effort.  ABD:  No distention.  NEURO: Cranial nerves II to XII grossly intact.   ED Results / Procedures / Treatments   Labs (all labs ordered are listed, but only abnormal results are displayed) Labs Reviewed - No data to display   EKG  RADIOLOGY  I personally viewed and evaluated these images as part of my medical decision making, as well as reviewing the written report by the radiologist.  ED Provider Interpretation: No acute  findings  CT Head / Cervical Spine w/o CM  IMPRESSION: 1. No acute intracranial pathology. 2. No fracture or static subluxation of the cervical spine.   PROCEDURES:  Critical Care performed: No  Procedures   MEDICATIONS ORDERED IN ED: Medications  ibuprofen (ADVIL) tablet 800 mg (800 mg Oral Given 03/13/23 1915)     IMPRESSION / MDM / ASSESSMENT AND PLAN / ED COURSE  I reviewed the triage vital signs and the nursing notes.                              Differential diagnosis includes, but is not limited to, SDH, cranial fracture, concussion, cervical strain, cervical myalgia, cervical fracture, radiculopathy  Patient's presentation is most consistent with acute complicated illness / injury requiring diagnostic workup.  Patient's diagnosis is consistent with mechanical fall resulting in a minor head injury and neck strain.  Radiologic evidence of any acute intracranial process or cervical fracture or subluxation.  Patient with reassuring exam and workup at this time with no acute neuromuscular episodes.  Patient will  be discharged home with prescriptions for ibuprofen and instructions take OTC Tylenol as needed.  Patient is to follow up with her primary provider as discussed, as needed or otherwise directed. Patient is given ED precautions to return to the ED for any worsening or new symptoms.  FINAL CLINICAL IMPRESSION(S) / ED DIAGNOSES   Final diagnoses:  Minor head injury, initial encounter  Fall in home, initial encounter  Neck strain, initial encounter     Rx / DC Orders   ED Discharge Orders          Ordered    ibuprofen (ADVIL) 800 MG tablet  Every 8 hours PRN        03/13/23 1905             Note:  This document was prepared using Dragon voice recognition software and may include unintentional dictation errors.    Lissa Hoard, PA-C 03/16/23 0103    Janith Lima, MD 03/17/23 [phone]

## 2023-03-13 NOTE — ED Triage Notes (Signed)
Pt to ED C/O mechanical fall that occurred this morning. Reports when fall occurred, hit back of head. No LOC, No blood thinners. Pt a&o x4. Ambulatory. No s/s of acute distress noted.

## 8387-02-07 DEATH — deceased
